# Patient Record
Sex: Female | Born: 1965 | Race: White | Hispanic: No | Marital: Married | Smoking: Never smoker
Health system: Southern US, Community
[De-identification: ages and names within clinical notes are randomized; demographics above are authoritative.]

## PROBLEM LIST (undated history)

## (undated) DIAGNOSIS — Z98811 Dental restoration status: Secondary | ICD-10-CM

## (undated) DIAGNOSIS — I671 Cerebral aneurysm, nonruptured: Secondary | ICD-10-CM

## (undated) DIAGNOSIS — I10 Essential (primary) hypertension: Secondary | ICD-10-CM

## (undated) DIAGNOSIS — M1990 Unspecified osteoarthritis, unspecified site: Secondary | ICD-10-CM

## (undated) DIAGNOSIS — Z9889 Other specified postprocedural states: Secondary | ICD-10-CM

## (undated) DIAGNOSIS — R112 Nausea with vomiting, unspecified: Secondary | ICD-10-CM

## (undated) DIAGNOSIS — G43909 Migraine, unspecified, not intractable, without status migrainosus: Secondary | ICD-10-CM

## (undated) DIAGNOSIS — N6310 Unspecified lump in the right breast, unspecified quadrant: Secondary | ICD-10-CM

## (undated) HISTORY — PX: TONSILLECTOMY AND ADENOIDECTOMY: SUR1326

## (undated) HISTORY — PX: BUNIONECTOMY: SHX129

## (undated) HISTORY — PX: BREAST EXCISIONAL BIOPSY: SUR124

---

## 1999-01-01 ENCOUNTER — Other Ambulatory Visit: Admission: RE | Admit: 1999-01-01 | Discharge: 1999-01-01 | Payer: Self-pay | Admitting: Family Medicine

## 2001-08-25 ENCOUNTER — Emergency Department (HOSPITAL_COMMUNITY): Admission: EM | Admit: 2001-08-25 | Discharge: 2001-08-25 | Payer: Self-pay | Admitting: Emergency Medicine

## 2001-08-25 ENCOUNTER — Encounter: Payer: Self-pay | Admitting: Emergency Medicine

## 2002-06-22 ENCOUNTER — Other Ambulatory Visit: Admission: RE | Admit: 2002-06-22 | Discharge: 2002-06-22 | Payer: Self-pay | Admitting: Family Medicine

## 2003-08-21 ENCOUNTER — Other Ambulatory Visit: Admission: RE | Admit: 2003-08-21 | Discharge: 2003-08-21 | Payer: Self-pay | Admitting: Obstetrics and Gynecology

## 2005-04-29 ENCOUNTER — Ambulatory Visit: Payer: Self-pay

## 2005-05-26 ENCOUNTER — Ambulatory Visit: Payer: Self-pay

## 2005-06-12 ENCOUNTER — Other Ambulatory Visit: Admission: RE | Admit: 2005-06-12 | Discharge: 2005-06-12 | Payer: Self-pay | Admitting: Obstetrics and Gynecology

## 2006-06-29 ENCOUNTER — Other Ambulatory Visit: Admission: RE | Admit: 2006-06-29 | Discharge: 2006-06-29 | Payer: Self-pay | Admitting: Family Medicine

## 2007-03-01 ENCOUNTER — Ambulatory Visit: Payer: Self-pay | Admitting: Neurosurgery

## 2007-08-04 ENCOUNTER — Other Ambulatory Visit: Admission: RE | Admit: 2007-08-04 | Discharge: 2007-08-04 | Payer: Self-pay | Admitting: Family Medicine

## 2008-08-02 ENCOUNTER — Encounter: Admission: RE | Admit: 2008-08-02 | Discharge: 2008-08-02 | Payer: Self-pay | Admitting: Family Medicine

## 2008-12-05 ENCOUNTER — Encounter: Admission: RE | Admit: 2008-12-05 | Discharge: 2008-12-05 | Payer: Self-pay | Admitting: Family Medicine

## 2009-05-16 ENCOUNTER — Encounter: Admission: RE | Admit: 2009-05-16 | Discharge: 2009-05-16 | Payer: Self-pay | Admitting: Family Medicine

## 2009-05-29 ENCOUNTER — Encounter: Admission: RE | Admit: 2009-05-29 | Discharge: 2009-05-29 | Payer: Self-pay | Admitting: Family Medicine

## 2009-09-11 ENCOUNTER — Encounter: Admission: RE | Admit: 2009-09-11 | Discharge: 2009-09-11 | Payer: Self-pay | Admitting: Family Medicine

## 2009-10-24 ENCOUNTER — Ambulatory Visit: Payer: Self-pay | Admitting: Neurosurgery

## 2010-05-31 ENCOUNTER — Encounter: Payer: Self-pay | Admitting: Family Medicine

## 2010-09-29 ENCOUNTER — Other Ambulatory Visit: Payer: Self-pay | Admitting: Family Medicine

## 2010-09-29 DIAGNOSIS — N6320 Unspecified lump in the left breast, unspecified quadrant: Secondary | ICD-10-CM

## 2010-11-28 ENCOUNTER — Other Ambulatory Visit: Payer: Self-pay | Admitting: Family Medicine

## 2010-11-28 DIAGNOSIS — M549 Dorsalgia, unspecified: Secondary | ICD-10-CM

## 2010-12-02 ENCOUNTER — Ambulatory Visit
Admission: RE | Admit: 2010-12-02 | Discharge: 2010-12-02 | Disposition: A | Discharge status: Home / Self Care | Payer: Self-pay | Source: Ambulatory Visit | Attending: Family Medicine | Admitting: Family Medicine

## 2010-12-02 DIAGNOSIS — M549 Dorsalgia, unspecified: Secondary | ICD-10-CM

## 2011-01-26 ENCOUNTER — Other Ambulatory Visit: Payer: Self-pay | Admitting: Family Medicine

## 2011-01-26 DIAGNOSIS — M549 Dorsalgia, unspecified: Secondary | ICD-10-CM

## 2011-01-26 DIAGNOSIS — M5430 Sciatica, unspecified side: Secondary | ICD-10-CM

## 2011-01-30 ENCOUNTER — Ambulatory Visit
Admission: RE | Admit: 2011-01-30 | Discharge: 2011-01-30 | Disposition: A | Discharge status: Home / Self Care | Payer: PRIVATE HEALTH INSURANCE | Source: Ambulatory Visit | Attending: Family Medicine | Admitting: Family Medicine

## 2011-01-30 DIAGNOSIS — M5430 Sciatica, unspecified side: Secondary | ICD-10-CM

## 2011-01-30 DIAGNOSIS — M549 Dorsalgia, unspecified: Secondary | ICD-10-CM

## 2011-03-10 ENCOUNTER — Other Ambulatory Visit: Payer: Self-pay | Admitting: Family Medicine

## 2011-03-10 ENCOUNTER — Other Ambulatory Visit (HOSPITAL_COMMUNITY)
Admission: RE | Admit: 2011-03-10 | Discharge: 2011-03-10 | Disposition: A | Discharge status: Home / Self Care | Payer: PRIVATE HEALTH INSURANCE | Source: Ambulatory Visit | Attending: Family Medicine | Admitting: Family Medicine

## 2011-03-10 DIAGNOSIS — Z0000 Encounter for general adult medical examination without abnormal findings: Secondary | ICD-10-CM | POA: Insufficient documentation
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
## 2011-04-28 ENCOUNTER — Encounter (HOSPITAL_BASED_OUTPATIENT_CLINIC_OR_DEPARTMENT_OTHER): Payer: Self-pay | Admitting: *Deleted

## 2011-04-28 NOTE — Pre-Procedure Instructions (Addendum)
To come for BMET and EKG  Last MRI req. from [facility]

## 2011-04-29 ENCOUNTER — Encounter (HOSPITAL_BASED_OUTPATIENT_CLINIC_OR_DEPARTMENT_OTHER): Payer: Self-pay | Admitting: *Deleted

## 2011-04-29 ENCOUNTER — Other Ambulatory Visit: Payer: Self-pay

## 2011-04-29 ENCOUNTER — Encounter (HOSPITAL_BASED_OUTPATIENT_CLINIC_OR_DEPARTMENT_OTHER)
Admission: RE | Admit: 2011-04-29 | Discharge: 2011-04-29 | Disposition: A | Discharge status: Home / Self Care | Payer: 59 | Source: Ambulatory Visit | Attending: Orthopedic Surgery | Admitting: Orthopedic Surgery

## 2011-04-29 LAB — BASIC METABOLIC PANEL
BUN: 7 mg/dL (ref 6–23)
CO2: 29 mEq/L (ref 19–32)
GFR calc Af Amer: >90 mL/min (ref >90)
GFR calc non Af Amer: >90 mL/min (ref >90)
Glucose, Bld: 103 mg/dL — ABNORMAL HIGH (ref 70–99)

## 2011-04-29 NOTE — Progress Notes (Signed)
Dr. Gelene Mink reviewed MRI and note from [facility] on cerebral aneurysm --- ok for surgery

## 2011-04-30 ENCOUNTER — Encounter (HOSPITAL_BASED_OUTPATIENT_CLINIC_OR_DEPARTMENT_OTHER): Payer: Self-pay | Admitting: Anesthesiology

## 2011-04-30 ENCOUNTER — Encounter (HOSPITAL_BASED_OUTPATIENT_CLINIC_OR_DEPARTMENT_OTHER): Payer: Self-pay

## 2011-04-30 ENCOUNTER — Ambulatory Visit (HOSPITAL_BASED_OUTPATIENT_CLINIC_OR_DEPARTMENT_OTHER)
Admission: RE | Admit: 2011-04-30 | Discharge: 2011-04-30 | Disposition: A | Discharge status: Home / Self Care | Payer: 59 | Source: Ambulatory Visit | Attending: Orthopedic Surgery | Admitting: Orthopedic Surgery

## 2011-04-30 ENCOUNTER — Ambulatory Visit (HOSPITAL_BASED_OUTPATIENT_CLINIC_OR_DEPARTMENT_OTHER): Payer: 59 | Admitting: Anesthesiology

## 2011-04-30 ENCOUNTER — Encounter (HOSPITAL_BASED_OUTPATIENT_CLINIC_OR_DEPARTMENT_OTHER)
Admission: RE | Disposition: A | Discharge status: Home / Self Care | Payer: Self-pay | Source: Ambulatory Visit | Attending: Orthopedic Surgery

## 2011-04-30 DIAGNOSIS — Z4789 Encounter for other orthopedic aftercare: Secondary | ICD-10-CM

## 2011-04-30 DIAGNOSIS — M949 Disorder of cartilage, unspecified: Secondary | ICD-10-CM | POA: Insufficient documentation

## 2011-04-30 DIAGNOSIS — M25819 Other specified joint disorders, unspecified shoulder: Secondary | ICD-10-CM | POA: Insufficient documentation

## 2011-04-30 DIAGNOSIS — Z01810 Encounter for preprocedural cardiovascular examination: Secondary | ICD-10-CM | POA: Insufficient documentation

## 2011-04-30 DIAGNOSIS — M67919 Unspecified disorder of synovium and tendon, unspecified shoulder: Secondary | ICD-10-CM | POA: Insufficient documentation

## 2011-04-30 DIAGNOSIS — Z01812 Encounter for preprocedural laboratory examination: Secondary | ICD-10-CM | POA: Insufficient documentation

## 2011-04-30 DIAGNOSIS — M899 Disorder of bone, unspecified: Secondary | ICD-10-CM | POA: Insufficient documentation

## 2011-04-30 DIAGNOSIS — M719 Bursopathy, unspecified: Secondary | ICD-10-CM | POA: Insufficient documentation

## 2011-04-30 HISTORY — DX: Cerebral aneurysm, nonruptured: I67.1

## 2011-04-30 HISTORY — DX: Essential (primary) hypertension: I10

## 2011-04-30 HISTORY — PX: SHOULDER ARTHROSCOPY: SHX128

## 2011-04-30 SURGERY — ARTHROSCOPY, SHOULDER
Anesthesia: General | Site: Shoulder | Laterality: Right | Wound class: Clean

## 2011-04-30 MED ORDER — NEOSTIGMINE METHYLSULFATE 1 MG/ML IJ SOLN
INTRAMUSCULAR | Status: DC | PRN
  Administered 2011-04-30: 4 mg via INTRAVENOUS

## 2011-04-30 MED ORDER — CEFAZOLIN SODIUM 1-5 GM-% IV SOLN: 1.0000 g | INTRAVENOUS | Status: DC

## 2011-04-30 MED ORDER — MIDAZOLAM HCL 5 MG/5ML IJ SOLN
INTRAMUSCULAR | Status: DC | PRN
  Administered 2011-04-30: 2 mg via INTRAVENOUS

## 2011-04-30 MED ORDER — KETOROLAC TROMETHAMINE 30 MG/ML IJ SOLN: 15.0000 mg | Freq: Once | INTRAMUSCULAR | Status: DC | PRN

## 2011-04-30 MED ORDER — ONDANSETRON HCL 4 MG/2ML IJ SOLN
INTRAMUSCULAR | Status: DC | PRN
  Administered 2011-04-30: 4 mg via INTRAVENOUS

## 2011-04-30 MED ORDER — SODIUM CHLORIDE 0.9 % IR SOLN
Status: DC | PRN
  Administered 2011-04-30: 3000 mL

## 2011-04-30 MED ORDER — FENTANYL CITRATE 0.05 MG/ML IJ SOLN
INTRAMUSCULAR | Status: DC | PRN
  Administered 2011-04-30: 100 ug via INTRAVENOUS
  Administered 2011-04-30: 50 ug via INTRAVENOUS

## 2011-04-30 MED ORDER — CHLORHEXIDINE GLUCONATE 4 % EX LIQD: 60.0000 mL | Freq: Once | CUTANEOUS | Status: DC

## 2011-04-30 MED ORDER — HYDROMORPHONE HCL PF 1 MG/ML IJ SOLN
0.2500 mg | INTRAMUSCULAR | Status: DC | PRN
  Administered 2011-04-30 (×4): 0.5 mg via INTRAVENOUS

## 2011-04-30 MED ORDER — PROMETHAZINE HCL 25 MG/ML IJ SOLN: 6.2500 mg | INTRAMUSCULAR | Status: DC | PRN

## 2011-04-30 MED ORDER — BUPIVACAINE HCL (PF) 0.5 % IJ SOLN
INTRAMUSCULAR | Status: DC | PRN
  Administered 2011-04-30: 20 mL via INTRA_ARTICULAR

## 2011-04-30 MED ORDER — ROCURONIUM BROMIDE 100 MG/10ML IV SOLN
INTRAVENOUS | Status: DC | PRN
  Administered 2011-04-30: 40 mg via INTRAVENOUS

## 2011-04-30 MED ORDER — DEXAMETHASONE SODIUM PHOSPHATE 4 MG/ML IJ SOLN
INTRAMUSCULAR | Status: DC | PRN
  Administered 2011-04-30: 10 mg via INTRAVENOUS

## 2011-04-30 MED ORDER — CEFAZOLIN SODIUM-DEXTROSE 2-3 GM-% IV SOLR
2.0000 g | INTRAVENOUS | Status: AC
  Administered 2011-04-30: 2 g via INTRAVENOUS

## 2011-04-30 MED ORDER — KETOROLAC TROMETHAMINE 30 MG/ML IJ SOLN
INTRAMUSCULAR | Status: DC | PRN
  Administered 2011-04-30: 30 mg via INTRAVENOUS

## 2011-04-30 MED ORDER — LACTATED RINGERS IV SOLN
INTRAVENOUS | Status: DC
  Administered 2011-04-30 (×2): via INTRAVENOUS

## 2011-04-30 MED ORDER — PROPOFOL 10 MG/ML IV EMUL
INTRAVENOUS | Status: DC | PRN
  Administered 2011-04-30: 200 mg via INTRAVENOUS

## 2011-04-30 MED ORDER — GLYCOPYRROLATE 0.2 MG/ML IJ SOLN
INTRAMUSCULAR | Status: DC | PRN
  Administered 2011-04-30: .6 mg via INTRAVENOUS

## 2011-04-30 SURGICAL SUPPLY — 71 items
BENZOIN TINCTURE PRP APPL 2/3 (GAUZE/BANDAGES/DRESSINGS) IMPLANT
BLADE CUTTER GATOR 3.5 (BLADE) ×2 IMPLANT
BLADE CUTTER MENIS 5.5 (BLADE) IMPLANT
BLADE GREAT WHITE 4.2 (BLADE) ×2 IMPLANT
BLADE SURG 15 STRL LF DISP TIS (BLADE) IMPLANT
BLADE SURG 15 STRL SS (BLADE)
BUR OVAL 6.0 (BURR) ×2 IMPLANT
CANISTER OMNI JUG 16 LITER (MISCELLANEOUS) IMPLANT
CANISTER SUCTION 2500CC (MISCELLANEOUS) IMPLANT
CANNULA TWIST IN 8.25X7CM (CANNULA) IMPLANT
CLOTH BEACON ORANGE TIMEOUT ST (SAFETY) ×2 IMPLANT
DECANTER SPIKE VIAL GLASS SM (MISCELLANEOUS) IMPLANT
DRAPE OEC MINIVIEW 54X84 (DRAPES) IMPLANT
DRAPE STERI 35X30 U-POUCH (DRAPES) ×2 IMPLANT
DRAPE U-SHAPE 47X51 STRL (DRAPES) ×2 IMPLANT
DRAPE U-SHAPE 76X120 STRL (DRAPES) ×4 IMPLANT
DRSG PAD ABDOMINAL 8X10 ST (GAUZE/BANDAGES/DRESSINGS) ×2 IMPLANT
DURAPREP 26ML APPLICATOR (WOUND CARE) ×2 IMPLANT
ELECT MENISCUS 165MM 90D (ELECTRODE) ×2 IMPLANT
ELECT NEEDLE TIP 2.8 STRL (NEEDLE) IMPLANT
ELECT REM PT RETURN 9FT ADLT (ELECTROSURGICAL) ×2
ELECTRODE REM PT RTRN 9FT ADLT (ELECTROSURGICAL) ×1 IMPLANT
GAUZE XEROFORM 1X8 LF (GAUZE/BANDAGES/DRESSINGS) ×2 IMPLANT
GLOVE BIO SURGEON STRL SZ 6.5 (GLOVE) ×4 IMPLANT
GLOVE BIOGEL PI IND STRL 7.0 (GLOVE) ×2 IMPLANT
GLOVE BIOGEL PI IND STRL 8 (GLOVE) ×1 IMPLANT
GLOVE BIOGEL PI INDICATOR 7.0 (GLOVE) ×2
GLOVE BIOGEL PI INDICATOR 8 (GLOVE) ×1
GLOVE ORTHO TXT STRL SZ7.5 (GLOVE) ×4 IMPLANT
GOWN BRE IMP PREV XXLGXLNG (GOWN DISPOSABLE) ×2 IMPLANT
GOWN PREVENTION PLUS XLARGE (GOWN DISPOSABLE) ×6 IMPLANT
NDL SUT 6 .5 CRC .975X.05 MAYO (NEEDLE) IMPLANT
NEEDLE MAYO TAPER (NEEDLE)
NEEDLE SCORPION MULTI FIRE (NEEDLE) IMPLANT
NS IRRIG 1000ML POUR BTL (IV SOLUTION) IMPLANT
PACK ARTHROSCOPY DSU (CUSTOM PROCEDURE TRAY) ×2 IMPLANT
PACK BASIN DAY SURGERY FS (CUSTOM PROCEDURE TRAY) ×2 IMPLANT
PASSER SUT SWANSON 36MM LOOP (INSTRUMENTS) IMPLANT
PENCIL BUTTON HOLSTER BLD 10FT (ELECTRODE) ×2 IMPLANT
SET ARTHROSCOPY TUBING (MISCELLANEOUS) ×1
SET ARTHROSCOPY TUBING LN (MISCELLANEOUS) ×1 IMPLANT
SLEEVE SCD COMPRESS KNEE MED (MISCELLANEOUS) ×2 IMPLANT
SLING ARM FOAM STRAP LRG (SOFTGOODS) ×2 IMPLANT
SLING ARM FOAM STRAP MED (SOFTGOODS) IMPLANT
SLING ARM FOAM STRAP XLG (SOFTGOODS) IMPLANT
SLING ARM IMMOBILIZER LRG (SOFTGOODS) IMPLANT
SLING ARM IMMOBILIZER MED (SOFTGOODS) IMPLANT
SLING ARM LGE (CAST SUPPLIES) ×2 IMPLANT
SPONGE GAUZE 4X4 12PLY (GAUZE/BANDAGES/DRESSINGS) ×2 IMPLANT
SPONGE LAP 4X18 X RAY DECT (DISPOSABLE) IMPLANT
STRIP CLOSURE SKIN 1/2X4 (GAUZE/BANDAGES/DRESSINGS) IMPLANT
SUCTION FRAZIER TIP 10 FR DISP (SUCTIONS) IMPLANT
SUT ETHIBOND 2 OS 4 DA (SUTURE) IMPLANT
SUT ETHILON 2 0 FS 18 (SUTURE) IMPLANT
SUT ETHILON 3 0 PS 1 (SUTURE) ×2 IMPLANT
SUT FIBERWIRE #2 38 T-5 BLUE (SUTURE)
SUT RETRIEVER MED (INSTRUMENTS) IMPLANT
SUT STEEL 4 (SUTURE) IMPLANT
SUT STEEL 5 (SUTURE) IMPLANT
SUT TIGER TAPE 7 IN WHITE (SUTURE) IMPLANT
SUT VIC AB 0 CT1 27 (SUTURE)
SUT VIC AB 0 CT1 27XBRD ANBCTR (SUTURE) IMPLANT
SUT VIC AB 2-0 SH 27 (SUTURE)
SUT VIC AB 2-0 SH 27XBRD (SUTURE) IMPLANT
SUT VIC AB 3-0 FS2 27 (SUTURE) IMPLANT
SUTURE FIBERWR #2 38 T-5 BLUE (SUTURE) IMPLANT
TAPE CLOTH SURG 6X10 WHT LF (GAUZE/BANDAGES/DRESSINGS) ×2 IMPLANT
TAPE FIBER 2MM 7IN #2 BLUE (SUTURE) IMPLANT
TOWEL OR 17X24 6PK STRL BLUE (TOWEL DISPOSABLE) ×2 IMPLANT
WATER STERILE IRR 1000ML POUR (IV SOLUTION) ×2 IMPLANT
YANKAUER SUCT BULB TIP NO VENT (SUCTIONS) IMPLANT

## 2011-04-30 NOTE — Brief Op Note (Signed)
04/30/2011  12:21 PM  PATIENT:  [name] [name]  45 y.o. female  PRE-OPERATIVE DIAGNOSIS:  Right  shoulder degenerative arthritis with impingment  POST-OPERATIVE DIAGNOSIS:  Right  shoulder degenerative arthritis with impingment, partial thickness rotator cuff tear  PROCEDURE:  Procedure(s): Right ARTHROSCOPY SHOULDER with sad, dce, debridement  SURGEON:  Surgeon(s): Loreta Ave, MD  PHYSICIAN ASSISTANT: Zonia Kief M   ANESTHESIA:   general  EBL:  Total I/O In: 800 [I.V.:800] Out: -    SPECIMEN:  No Specimen  DISPOSITION OF SPECIMEN:  N/A  COUNTS:  YES  TOURNIQUET:  * No tourniquets in log *  PATIENT DISPOSITION:  PACU - hemodynamically stable.

## 2011-04-30 NOTE — Anesthesia Postprocedure Evaluation (Signed)
Anesthesia Post Note  Patient: [name] [name]  Procedure(s) Performed:  ARTHROSCOPY SHOULDER - right shoulder scope, distal claviculectomy,decompression subacromial partial acromioplasty with coracoacromial release  Anesthesia type: General  Patient location: PACU  Post pain: Pain level controlled and Adequate analgesia  Post assessment: Post-op Vital signs reviewed, Patient's Cardiovascular Status Stable, Respiratory Function Stable, Patent Airway and Pain level controlled  Last Vitals:  Filed Vitals:   04/30/11 1245  BP: 101/62  Pulse: 62  Temp:   Resp: 15    Post vital signs: Reviewed and stable  Level of consciousness: awake, alert  and oriented  Complications: No apparent anesthesia complications

## 2011-04-30 NOTE — Anesthesia Procedure Notes (Signed)
Procedure Name: Intubation Performed by: Sharyne Richters Pre-anesthesia Checklist: Patient identified, Timeout performed, Emergency Drugs available, Suction available and Patient being monitored Patient Re-evaluated:Patient Re-evaluated prior to inductionOxygen Delivery Method: Circle System Utilized Preoxygenation: Pre-oxygenation with 100% oxygen Intubation Type: IV induction Ventilation: Mask ventilation without difficulty Laryngoscope Size: Miller and 2 Grade View: Grade II Tube type: Oral Number of attempts: 1 Placement Confirmation: ETT inserted through vocal cords under direct vision,  breath sounds checked- equal and bilateral and positive ETCO2 Secured at: 22 cm Tube secured with: Tape Dental Injury: Teeth and Oropharynx as per pre-operative assessment

## 2011-04-30 NOTE — Transfer of Care (Signed)
Immediate Anesthesia Transfer of Care Note  Patient: [name] [name]  Procedure(s) Performed:  ARTHROSCOPY SHOULDER - right shoulder scope, distal claviculectomy,decompression subacromial partial acromioplasty with coracoacromial release  Patient Location: PACU  Anesthesia Type: General  Level of Consciousness: awake, alert  and oriented  Airway & Oxygen Therapy: Patient Spontanous Breathing and Patient connected to face mask oxygen  Post-op Assessment: Report given to PACU RN and Post -op Vital signs reviewed and stable  Post vital signs: Reviewed and stable  Complications: No apparent anesthesia complications

## 2011-04-30 NOTE — Interval H&P Note (Signed)
History and Physical Interval Note:  04/30/2011 7:33 AM  [name] [name]  has presented today for surgery, with the diagnosis of rt shd degen arthritis, a/c joint, rc rupture  The various methods of treatment have been discussed with the patient and family. After consideration of risks, benefits and other options for treatment, the patient has consented to  Procedure(s):RIGHT ARTHROSCOPY SHOULDER as a surgical intervention .  The patients' history has been reviewed, patient examined, no change in status, stable for surgery.  I have reviewed the patients' chart and labs.  Questions were answered to the patient's satisfaction.     Princes Finger F

## 2011-04-30 NOTE — Anesthesia Preprocedure Evaluation (Addendum)
Anesthesia Evaluation  Patient identified by MRN, date of birth, ID band Patient awake  General Assessment Comment:Allergy to Lidocaine, discussed c Patient. We elected to forgo block this day c other -caine local anesthetics.  Reviewed: Allergy & Precautions, H&P , NPO status , Patient's Chart, lab work & pertinent test results  History of Anesthesia Complications Negative for: history of anesthetic complications  Airway Mallampati: I  Neck ROM: Full    Dental  (+) Teeth Intact and Dental Advisory Given   Pulmonary neg pulmonary ROS,  clear to auscultation        Cardiovascular hypertension, Pt. on medications Regular Normal    Neuro/Psych    GI/Hepatic negative GI ROS, Neg liver ROS,   Endo/Other  Negative Endocrine ROS  Renal/GU negative Renal ROS     Musculoskeletal  (+) Arthritis -, Osteoarthritis,    Abdominal (+) obese,   Peds  Hematology negative hematology ROS (+)   Anesthesia Other Findings   Reproductive/Obstetrics                          Anesthesia Physical Anesthesia Plan  ASA: II  Anesthesia Plan: General   Post-op Pain Management:    Induction: Intravenous  Airway Management Planned: Oral ETT  Additional Equipment:   Intra-op Plan:   Post-operative Plan:   Informed Consent: I have reviewed the patients History and Physical, chart, labs and discussed the procedure including the risks, benefits and alternatives for the proposed anesthesia with the patient or authorized representative who has indicated his/her understanding and acceptance.   Dental advisory given  Plan Discussed with: CRNA and Surgeon  Anesthesia Plan Comments:         Anesthesia Quick Evaluation

## 2011-05-01 ENCOUNTER — Encounter (HOSPITAL_BASED_OUTPATIENT_CLINIC_OR_DEPARTMENT_OTHER): Payer: Self-pay | Admitting: Orthopedic Surgery

## 2011-05-01 NOTE — Op Note (Signed)
NAME:  [name] [name], [name] [name]                    ACCOUNT NO.:  MEDICAL RECORD NO.:  [id number]  LOCATION:                                 FACILITY:  PHYSICIAN:  Loreta Ave, M.D. DATE OF BIRTH:  1965-09-24  DATE OF PROCEDURE:  04/30/2011 DATE OF DISCHARGE:                              OPERATIVE REPORT   PREOPERATIVE DIAGNOSES:  Right shoulder chronic impingement, failed conservative treatment.  Marked distal clavicle osteolysis.  POSTOPERATIVE DIAGNOSES:  Right shoulder chronic impingement, failed conservative treatment.  Marked distal clavicle osteolysis.  Some complex tearing, anterior and posterior labrum.  Partial thickness tearing, undersurface supraspinatus tendon, anterior aspect.  Partial tearing, superior border of subscapular tendon.  Nothing full-thickness.  PROCEDURE:  Right shoulder exam under anesthesia, arthroscopy. Debridement of rotator cuff and labrum.  Bursectomy, acromioplasty, CA ligament release.  Excision distal clavicle.  SURGEON:  Loreta Ave, MD  ASSISTANT:  Genene Churn. Barry Dienes, PA present throughout the entire case, necessary for timely completion of procedure.  ANESTHESIA:  General.  BLOOD LOSS:  Minimal.  SPECIMENS:  None.  CULTURES:  None.  COMPLICATION:  None.  DRESSINGS:  Soft compressive with sling.  PROCEDURE:  The patient was brought to the operating room, placed on the operating table in supine position.  After adequate anesthesia had been obtained, shoulder examined.  Full motion, stable shoulder.  Placed in beach chair position on the shoulder positioner, prepped and draped in usual sterile fashion.  Three portals anterior-posterior lateral. Arthroscope introduced, shoulder distended and inspected.  Complex tearing of the labrum in the front and back debrided.  Biceps tendon, biceps anchor, articular cartilage, capsule ligamentous structures otherwise looked good.  Marked tearing along the top of the subscap debrided.  Although  there was a small split there, it did not allow the biceps to sublux out.  Partial tearing on the undersurface of the supraspinatus crescent region also debrided.  Nothing full-thickness. Cannula redirected subacromially.  Type 2 acromion.  Marked abrasive changes on the top of the cuff from impingement from the acromion and also from grade 4 changes of marked spurring and hypertrophy, swelling at the [facility] joint.  Cuff debrided.  Bursa resected.  Acromioplasty to a type 1 acromion with shaver and high-speed bur.  CA ligament released. Distal clavicle exposed.  Periarticular spurs, lateral cm of clavicle resected.  Adequacy of debridement, decompression confirmed viewing throughout.  Instruments and fluids were removed.  Portals were closed with nylon.  Subacromial injection of Marcaine.  Sterile compressive dressing applied.  Sling applied.  Anesthesia reversed.  Brought to recovery room.  Tolerated surgery well.  No complications.     Loreta Ave, M.D.     DFM/MEDQ  D:  04/30/2011  T:  05/01/2011  Job:  [id number]

## 2011-05-22 ENCOUNTER — Encounter (HOSPITAL_BASED_OUTPATIENT_CLINIC_OR_DEPARTMENT_OTHER): Payer: Self-pay | Admitting: *Deleted

## 2011-05-22 ENCOUNTER — Emergency Department (INDEPENDENT_AMBULATORY_CARE_PROVIDER_SITE_OTHER): Payer: PRIVATE HEALTH INSURANCE

## 2011-05-22 ENCOUNTER — Emergency Department (HOSPITAL_BASED_OUTPATIENT_CLINIC_OR_DEPARTMENT_OTHER)
Admission: EM | Admit: 2011-05-22 | Discharge: 2011-05-23 | Disposition: A | Discharge status: Home / Self Care | Payer: PRIVATE HEALTH INSURANCE | Attending: Emergency Medicine | Admitting: Emergency Medicine

## 2011-05-22 DIAGNOSIS — R079 Chest pain, unspecified: Secondary | ICD-10-CM

## 2011-05-22 DIAGNOSIS — M25569 Pain in unspecified knee: Secondary | ICD-10-CM

## 2011-05-22 DIAGNOSIS — Y92410 Unspecified street and highway as the place of occurrence of the external cause: Secondary | ICD-10-CM | POA: Insufficient documentation

## 2011-05-22 DIAGNOSIS — I10 Essential (primary) hypertension: Secondary | ICD-10-CM | POA: Insufficient documentation

## 2011-05-22 DIAGNOSIS — R109 Unspecified abdominal pain: Secondary | ICD-10-CM | POA: Insufficient documentation

## 2011-05-22 DIAGNOSIS — S8000XA Contusion of unspecified knee, initial encounter: Secondary | ICD-10-CM

## 2011-05-22 DIAGNOSIS — IMO0002 Reserved for concepts with insufficient information to code with codable children: Secondary | ICD-10-CM

## 2011-05-22 LAB — URINALYSIS, ROUTINE W REFLEX MICROSCOPIC
Ketones, ur: NEGATIVE mg/dL
Leukocytes, UA: NEGATIVE
Nitrite: NEGATIVE
Specific Gravity, Urine: 1.01 (ref 1.005–1.030)
Urobilinogen, UA: 0.2 mg/dL (ref 0.0–1.0)
pH: 6 (ref 5.0–8.0)

## 2011-05-22 LAB — CBC
HCT: 40.1 % (ref 36.0–46.0)
Hemoglobin: 13.9 g/dL (ref 12.0–15.0)
MCH: 30.2 pg (ref 26.0–34.0)
MCHC: 34.7 g/dL (ref 30.0–36.0)
MCV: 87.2 fL (ref 78.0–100.0)

## 2011-05-22 LAB — LIPASE, BLOOD: Lipase: 31 U/L (ref 11–59)

## 2011-05-22 LAB — COMPREHENSIVE METABOLIC PANEL
Albumin: 4.1 g/dL (ref 3.5–5.2)
BUN: 12 mg/dL (ref 6–23)
Creatinine, Ser: 0.6 mg/dL (ref 0.50–1.10)
GFR calc Af Amer: >90 mL/min (ref >90)
Total Protein: 7.5 g/dL (ref 6.0–8.3)

## 2011-05-22 LAB — DIFFERENTIAL
Basophils Relative: 0 % (ref 0–1)
Eosinophils Absolute: 0.2 10*3/uL (ref 0.0–0.7)
Eosinophils Relative: 2 % (ref 0–5)
Monocytes Absolute: 0.6 10*3/uL (ref 0.1–1.0)
Monocytes Relative: 7 % (ref 3–12)

## 2011-05-22 MED ORDER — SODIUM CHLORIDE 0.9 % IV SOLN
Freq: Once | INTRAVENOUS | Status: AC
  Administered 2011-05-22: 1000 mL via INTRAVENOUS

## 2011-05-22 MED ORDER — ACETAMINOPHEN 325 MG PO TABS
650.0000 mg | ORAL_TABLET | Freq: Once | ORAL | Status: AC
  Administered 2011-05-22: 650 mg via ORAL
  Filled 2011-05-22: qty 2

## 2011-05-22 MED ORDER — IOHEXOL 300 MG/ML  SOLN
100.0000 mL | Freq: Once | INTRAMUSCULAR | Status: AC | PRN
  Administered 2011-05-22: 100 mL via INTRAVENOUS
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
## 2011-07-09 ENCOUNTER — Ambulatory Visit
Admission: RE | Admit: 2011-07-09 | Discharge: 2011-07-09 | Disposition: A | Discharge status: Home / Self Care | Payer: Self-pay | Source: Ambulatory Visit | Attending: Family Medicine | Admitting: Family Medicine

## 2011-07-09 DIAGNOSIS — N6320 Unspecified lump in the left breast, unspecified quadrant: Secondary | ICD-10-CM

## 2012-05-27 ENCOUNTER — Other Ambulatory Visit: Payer: Self-pay | Admitting: Family Medicine

## 2012-05-27 DIAGNOSIS — Z1231 Encounter for screening mammogram for malignant neoplasm of breast: Secondary | ICD-10-CM
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
## 2012-06-03 ENCOUNTER — Other Ambulatory Visit (HOSPITAL_COMMUNITY)
Admission: RE | Admit: 2012-06-03 | Discharge: 2012-06-03 | Disposition: A | Discharge status: Home / Self Care | Payer: 59 | Source: Ambulatory Visit | Attending: Family Medicine | Admitting: Family Medicine

## 2012-06-03 ENCOUNTER — Other Ambulatory Visit: Payer: Self-pay | Admitting: Family Medicine

## 2012-06-03 DIAGNOSIS — Z0000 Encounter for general adult medical examination without abnormal findings: Secondary | ICD-10-CM | POA: Insufficient documentation

## 2012-07-15 ENCOUNTER — Ambulatory Visit: Payer: 59

## 2012-07-29 ENCOUNTER — Ambulatory Visit
Admission: RE | Admit: 2012-07-29 | Discharge: 2012-07-29 | Disposition: A | Discharge status: Home / Self Care | Payer: 59 | Source: Ambulatory Visit | Attending: Family Medicine | Admitting: Family Medicine

## 2012-07-29 DIAGNOSIS — Z1231 Encounter for screening mammogram for malignant neoplasm of breast: Secondary | ICD-10-CM

## 2012-08-03 ENCOUNTER — Other Ambulatory Visit: Payer: Self-pay | Admitting: Family Medicine

## 2012-08-03 DIAGNOSIS — R928 Other abnormal and inconclusive findings on diagnostic imaging of breast: Secondary | ICD-10-CM

## 2012-08-25 ENCOUNTER — Ambulatory Visit
Admission: RE | Admit: 2012-08-25 | Discharge: 2012-08-25 | Disposition: A | Discharge status: Home / Self Care | Payer: 59 | Source: Ambulatory Visit | Attending: Family Medicine | Admitting: Family Medicine

## 2012-08-25 DIAGNOSIS — R928 Other abnormal and inconclusive findings on diagnostic imaging of breast: Secondary | ICD-10-CM

## 2013-04-28 ENCOUNTER — Other Ambulatory Visit: Payer: Self-pay

## 2013-04-28 ENCOUNTER — Other Ambulatory Visit: Payer: Self-pay | Admitting: Family Medicine

## 2013-04-28 DIAGNOSIS — R921 Mammographic calcification found on diagnostic imaging of breast: Secondary | ICD-10-CM

## 2013-05-05 ENCOUNTER — Ambulatory Visit
Admission: RE | Admit: 2013-05-05 | Discharge: 2013-05-05 | Disposition: A | Discharge status: Home / Self Care | Payer: 59 | Source: Ambulatory Visit | Attending: Family Medicine | Admitting: Family Medicine

## 2013-05-05 DIAGNOSIS — R921 Mammographic calcification found on diagnostic imaging of breast: Secondary | ICD-10-CM

## 2013-10-16 ENCOUNTER — Other Ambulatory Visit: Payer: Self-pay | Admitting: Family Medicine

## 2013-10-16 DIAGNOSIS — R921 Mammographic calcification found on diagnostic imaging of breast: Secondary | ICD-10-CM

## 2013-10-25 ENCOUNTER — Encounter (INDEPENDENT_AMBULATORY_CARE_PROVIDER_SITE_OTHER): Payer: Self-pay

## 2013-10-25 ENCOUNTER — Ambulatory Visit
Admission: RE | Admit: 2013-10-25 | Discharge: 2013-10-25 | Disposition: A | Discharge status: Home / Self Care | Payer: 59 | Source: Ambulatory Visit | Attending: Family Medicine | Admitting: Family Medicine

## 2013-10-25 DIAGNOSIS — R921 Mammographic calcification found on diagnostic imaging of breast: Secondary | ICD-10-CM

## 2014-10-16 ENCOUNTER — Other Ambulatory Visit: Payer: Self-pay

## 2014-10-16 DIAGNOSIS — Z1231 Encounter for screening mammogram for malignant neoplasm of breast: Secondary | ICD-10-CM

## 2014-11-07 ENCOUNTER — Ambulatory Visit: Payer: 59

## 2014-11-09 ENCOUNTER — Ambulatory Visit
Admission: RE | Admit: 2014-11-09 | Discharge: 2014-11-09 | Disposition: A | Discharge status: Home / Self Care | Payer: 59 | Source: Ambulatory Visit

## 2014-11-09 DIAGNOSIS — Z1231 Encounter for screening mammogram for malignant neoplasm of breast: Secondary | ICD-10-CM

## 2014-11-14 ENCOUNTER — Other Ambulatory Visit: Payer: Self-pay | Admitting: Family Medicine

## 2014-11-14 DIAGNOSIS — R928 Other abnormal and inconclusive findings on diagnostic imaging of breast: Secondary | ICD-10-CM

## 2014-11-20 ENCOUNTER — Other Ambulatory Visit: Payer: 59

## 2014-11-23 ENCOUNTER — Ambulatory Visit
Admission: RE | Admit: 2014-11-23 | Discharge: 2014-11-23 | Disposition: A | Discharge status: Home / Self Care | Payer: 59 | Source: Ambulatory Visit | Attending: Family Medicine | Admitting: Family Medicine

## 2014-11-23 DIAGNOSIS — R928 Other abnormal and inconclusive findings on diagnostic imaging of breast: Secondary | ICD-10-CM

## 2015-08-22 ENCOUNTER — Other Ambulatory Visit: Payer: Self-pay | Admitting: Family Medicine

## 2015-08-22 ENCOUNTER — Other Ambulatory Visit (HOSPITAL_COMMUNITY)
Admission: RE | Admit: 2015-08-22 | Discharge: 2015-08-22 | Disposition: A | Discharge status: Home / Self Care | Payer: 59 | Source: Ambulatory Visit | Attending: Family Medicine | Admitting: Family Medicine

## 2015-08-22 DIAGNOSIS — Z01419 Encounter for gynecological examination (general) (routine) without abnormal findings: Secondary | ICD-10-CM | POA: Insufficient documentation

## 2015-08-23 LAB — CYTOLOGY - PAP

## 2015-12-11 ENCOUNTER — Other Ambulatory Visit: Payer: Self-pay | Admitting: Family Medicine

## 2015-12-11 DIAGNOSIS — R921 Mammographic calcification found on diagnostic imaging of breast: Secondary | ICD-10-CM

## 2015-12-16 ENCOUNTER — Other Ambulatory Visit: Payer: Self-pay | Admitting: Family Medicine

## 2015-12-16 ENCOUNTER — Ambulatory Visit
Admission: RE | Admit: 2015-12-16 | Discharge: 2015-12-16 | Disposition: A | Discharge status: Home / Self Care | Payer: 59 | Source: Ambulatory Visit | Attending: Family Medicine | Admitting: Family Medicine

## 2015-12-16 DIAGNOSIS — R921 Mammographic calcification found on diagnostic imaging of breast: Secondary | ICD-10-CM

## 2015-12-16 DIAGNOSIS — N6489 Other specified disorders of breast: Secondary | ICD-10-CM

## 2015-12-17 ENCOUNTER — Ambulatory Visit
Admission: RE | Admit: 2015-12-17 | Discharge: 2015-12-17 | Disposition: A | Discharge status: Home / Self Care | Payer: 59 | Source: Ambulatory Visit | Attending: Family Medicine | Admitting: Family Medicine

## 2015-12-17 DIAGNOSIS — N6489 Other specified disorders of breast: Secondary | ICD-10-CM

## 2015-12-31 ENCOUNTER — Other Ambulatory Visit: Payer: Self-pay | Admitting: General Surgery

## 2015-12-31 DIAGNOSIS — R928 Other abnormal and inconclusive findings on diagnostic imaging of breast: Secondary | ICD-10-CM

## 2016-01-10 DIAGNOSIS — N6310 Unspecified lump in the right breast, unspecified quadrant: Secondary | ICD-10-CM

## 2016-01-10 HISTORY — DX: Unspecified lump in the right breast, unspecified quadrant: N63.10

## 2016-01-31 ENCOUNTER — Encounter (HOSPITAL_BASED_OUTPATIENT_CLINIC_OR_DEPARTMENT_OTHER): Payer: Self-pay | Admitting: *Deleted

## 2016-01-31 NOTE — Pre-Procedure Instructions (Signed)
To come for BMET, EKG and to pick up 8 oz. Boost Breeze 

## 2016-02-06 ENCOUNTER — Other Ambulatory Visit: Payer: Self-pay

## 2016-02-06 ENCOUNTER — Encounter (HOSPITAL_BASED_OUTPATIENT_CLINIC_OR_DEPARTMENT_OTHER)
Admission: RE | Admit: 2016-02-06 | Discharge: 2016-02-06 | Disposition: A | Discharge status: Home / Self Care | Payer: 59 | Source: Ambulatory Visit | Attending: General Surgery | Admitting: General Surgery

## 2016-02-06 ENCOUNTER — Ambulatory Visit
Admission: RE | Admit: 2016-02-06 | Discharge: 2016-02-06 | Disposition: A | Discharge status: Home / Self Care | Payer: 59 | Source: Ambulatory Visit | Attending: General Surgery | Admitting: General Surgery

## 2016-02-06 DIAGNOSIS — Z9889 Other specified postprocedural states: Secondary | ICD-10-CM | POA: Diagnosis not present

## 2016-02-06 DIAGNOSIS — Z8249 Family history of ischemic heart disease and other diseases of the circulatory system: Secondary | ICD-10-CM | POA: Diagnosis not present

## 2016-02-06 DIAGNOSIS — Z808 Family history of malignant neoplasm of other organs or systems: Secondary | ICD-10-CM | POA: Diagnosis not present

## 2016-02-06 DIAGNOSIS — N63 Unspecified lump in breast: Secondary | ICD-10-CM | POA: Diagnosis present

## 2016-02-06 DIAGNOSIS — M1990 Unspecified osteoarthritis, unspecified site: Secondary | ICD-10-CM | POA: Diagnosis not present

## 2016-02-06 DIAGNOSIS — Z79899 Other long term (current) drug therapy: Secondary | ICD-10-CM | POA: Diagnosis not present

## 2016-02-06 DIAGNOSIS — I10 Essential (primary) hypertension: Secondary | ICD-10-CM | POA: Diagnosis not present

## 2016-02-06 DIAGNOSIS — R928 Other abnormal and inconclusive findings on diagnostic imaging of breast: Secondary | ICD-10-CM

## 2016-02-06 DIAGNOSIS — N6011 Diffuse cystic mastopathy of right breast: Secondary | ICD-10-CM | POA: Diagnosis not present

## 2016-02-06 DIAGNOSIS — Z887 Allergy status to serum and vaccine status: Secondary | ICD-10-CM | POA: Diagnosis not present

## 2016-02-06 DIAGNOSIS — Z885 Allergy status to narcotic agent status: Secondary | ICD-10-CM | POA: Diagnosis not present

## 2016-02-06 DIAGNOSIS — Z8349 Family history of other endocrine, nutritional and metabolic diseases: Secondary | ICD-10-CM | POA: Diagnosis not present

## 2016-02-06 DIAGNOSIS — Z8371 Family history of colonic polyps: Secondary | ICD-10-CM | POA: Diagnosis not present

## 2016-02-06 DIAGNOSIS — Z884 Allergy status to anesthetic agent status: Secondary | ICD-10-CM | POA: Diagnosis not present

## 2016-02-06 LAB — BASIC METABOLIC PANEL
ANION GAP: 8 (ref 5–15)
BUN: 11 mg/dL (ref 6–20)
CHLORIDE: 104 mmol/L (ref 101–111)
CO2: 24 mmol/L (ref 22–32)
CREATININE: 0.65 mg/dL (ref 0.44–1.00)
Calcium: 9.5 mg/dL (ref 8.9–10.3)
GFR calc non Af Amer: >60 mL/min (ref >60)
Glucose, Bld: 92 mg/dL (ref 65–99)
POTASSIUM: 3.8 mmol/L (ref 3.5–5.1)
SODIUM: 136 mmol/L (ref 135–145)

## 2016-02-06 NOTE — Pre-Procedure Instructions (Signed)
Boost Breeze 8 oz. given to pt. with instructions to drink by 0415 DOS

## 2016-02-07 ENCOUNTER — Ambulatory Visit (HOSPITAL_BASED_OUTPATIENT_CLINIC_OR_DEPARTMENT_OTHER): Payer: 59 | Admitting: Certified Registered"

## 2016-02-07 ENCOUNTER — Encounter (HOSPITAL_BASED_OUTPATIENT_CLINIC_OR_DEPARTMENT_OTHER): Payer: Self-pay | Admitting: *Deleted

## 2016-02-07 ENCOUNTER — Ambulatory Visit (HOSPITAL_BASED_OUTPATIENT_CLINIC_OR_DEPARTMENT_OTHER)
Admission: RE | Admit: 2016-02-07 | Discharge: 2016-02-07 | Disposition: A | Discharge status: Home / Self Care | Payer: 59 | Source: Ambulatory Visit | Attending: General Surgery | Admitting: General Surgery

## 2016-02-07 ENCOUNTER — Ambulatory Visit
Admission: RE | Admit: 2016-02-07 | Discharge: 2016-02-07 | Disposition: A | Discharge status: Home / Self Care | Payer: 59 | Source: Ambulatory Visit | Attending: General Surgery | Admitting: General Surgery

## 2016-02-07 ENCOUNTER — Encounter (HOSPITAL_BASED_OUTPATIENT_CLINIC_OR_DEPARTMENT_OTHER)
Admission: RE | Disposition: A | Discharge status: Home / Self Care | Payer: Self-pay | Source: Ambulatory Visit | Attending: General Surgery

## 2016-02-07 DIAGNOSIS — N6011 Diffuse cystic mastopathy of right breast: Secondary | ICD-10-CM | POA: Diagnosis not present

## 2016-02-07 DIAGNOSIS — Z887 Allergy status to serum and vaccine status: Secondary | ICD-10-CM | POA: Insufficient documentation

## 2016-02-07 DIAGNOSIS — Z808 Family history of malignant neoplasm of other organs or systems: Secondary | ICD-10-CM | POA: Insufficient documentation

## 2016-02-07 DIAGNOSIS — M1990 Unspecified osteoarthritis, unspecified site: Secondary | ICD-10-CM | POA: Insufficient documentation

## 2016-02-07 DIAGNOSIS — Z79899 Other long term (current) drug therapy: Secondary | ICD-10-CM | POA: Insufficient documentation

## 2016-02-07 DIAGNOSIS — I10 Essential (primary) hypertension: Secondary | ICD-10-CM | POA: Insufficient documentation

## 2016-02-07 DIAGNOSIS — Z9889 Other specified postprocedural states: Secondary | ICD-10-CM | POA: Insufficient documentation

## 2016-02-07 DIAGNOSIS — Z885 Allergy status to narcotic agent status: Secondary | ICD-10-CM | POA: Insufficient documentation

## 2016-02-07 DIAGNOSIS — Z8249 Family history of ischemic heart disease and other diseases of the circulatory system: Secondary | ICD-10-CM | POA: Insufficient documentation

## 2016-02-07 DIAGNOSIS — R928 Other abnormal and inconclusive findings on diagnostic imaging of breast: Secondary | ICD-10-CM

## 2016-02-07 DIAGNOSIS — Z8349 Family history of other endocrine, nutritional and metabolic diseases: Secondary | ICD-10-CM | POA: Insufficient documentation

## 2016-02-07 DIAGNOSIS — Z884 Allergy status to anesthetic agent status: Secondary | ICD-10-CM | POA: Insufficient documentation

## 2016-02-07 DIAGNOSIS — Z8371 Family history of colonic polyps: Secondary | ICD-10-CM | POA: Insufficient documentation

## 2016-02-07 HISTORY — DX: Unspecified lump in the right breast, unspecified quadrant: N63.10

## 2016-02-07 HISTORY — DX: Nausea with vomiting, unspecified: Z98.890

## 2016-02-07 HISTORY — DX: Migraine, unspecified, not intractable, without status migrainosus: G43.909

## 2016-02-07 HISTORY — DX: Dental restoration status: Z98.811

## 2016-02-07 HISTORY — DX: Other specified postprocedural states: R11.2

## 2016-02-07 HISTORY — PX: RADIOACTIVE SEED GUIDED EXCISIONAL BREAST BIOPSY: SHX6490

## 2016-02-07 HISTORY — DX: Unspecified osteoarthritis, unspecified site: M19.90

## 2016-02-07 SURGERY — RADIOACTIVE SEED GUIDED BREAST BIOPSY
Anesthesia: General | Site: Breast | Laterality: Right

## 2016-02-07 MED ORDER — FENTANYL CITRATE (PF) 100 MCG/2ML IJ SOLN
50.0000 ug | INTRAMUSCULAR | Status: DC | PRN
  Administered 2016-02-07: 50 ug via INTRAVENOUS

## 2016-02-07 MED ORDER — PROPOFOL 10 MG/ML IV BOLUS
INTRAVENOUS | Status: DC | PRN
  Administered 2016-02-07: 200 mg via INTRAVENOUS

## 2016-02-07 MED ORDER — SCOPOLAMINE 1 MG/3DAYS TD PT72
1.0000 | MEDICATED_PATCH | Freq: Once | TRANSDERMAL | Status: DC | PRN
  Administered 2016-02-07: 1.5 mg via TRANSDERMAL

## 2016-02-07 MED ORDER — LACTATED RINGERS IV SOLN
INTRAVENOUS | Status: DC
  Administered 2016-02-07 (×2): via INTRAVENOUS

## 2016-02-07 MED ORDER — CEFAZOLIN SODIUM-DEXTROSE 2-4 GM/100ML-% IV SOLN
INTRAVENOUS | Status: AC
  Filled 2016-02-07: qty 100

## 2016-02-07 MED ORDER — BUPIVACAINE HCL (PF) 0.25 % IJ SOLN
INTRAMUSCULAR | Status: AC
  Filled 2016-02-07: qty 30

## 2016-02-07 MED ORDER — LIDOCAINE HCL (PF) 1 % IJ SOLN
INTRAMUSCULAR | Status: AC
  Filled 2016-02-07: qty 30

## 2016-02-07 MED ORDER — DEXAMETHASONE SODIUM PHOSPHATE 10 MG/ML IJ SOLN
INTRAMUSCULAR | Status: AC
  Filled 2016-02-07: qty 1

## 2016-02-07 MED ORDER — HYDROCODONE-ACETAMINOPHEN 10-325 MG PO TABS: 1.0000 | ORAL_TABLET | Freq: Four times a day (QID) | ORAL | 0 refills | Status: DC | PRN

## 2016-02-07 MED ORDER — ACETAMINOPHEN 500 MG PO TABS
ORAL_TABLET | ORAL | Status: AC
  Filled 2016-02-07: qty 2

## 2016-02-07 MED ORDER — HYDROMORPHONE HCL 1 MG/ML IJ SOLN
INTRAMUSCULAR | Status: AC
  Filled 2016-02-07: qty 1

## 2016-02-07 MED ORDER — MIDAZOLAM HCL 2 MG/2ML IJ SOLN
INTRAMUSCULAR | Status: AC
  Filled 2016-02-07: qty 2

## 2016-02-07 MED ORDER — OXYCODONE HCL 5 MG PO TABS
5.0000 mg | ORAL_TABLET | Freq: Once | ORAL | Status: AC
  Administered 2016-02-07: 5 mg via ORAL

## 2016-02-07 MED ORDER — ONDANSETRON HCL 4 MG/2ML IJ SOLN
INTRAMUSCULAR | Status: AC
  Filled 2016-02-07: qty 2

## 2016-02-07 MED ORDER — ONDANSETRON HCL 4 MG/2ML IJ SOLN
INTRAMUSCULAR | Status: DC | PRN
  Administered 2016-02-07: 4 mg via INTRAVENOUS

## 2016-02-07 MED ORDER — ACETAMINOPHEN 500 MG PO TABS
1000.0000 mg | ORAL_TABLET | ORAL | Status: AC
  Administered 2016-02-07: 1000 mg via ORAL

## 2016-02-07 MED ORDER — GLYCOPYRROLATE 0.2 MG/ML IJ SOLN: 0.2000 mg | Freq: Once | INTRAMUSCULAR | Status: DC | PRN

## 2016-02-07 MED ORDER — SCOPOLAMINE 1 MG/3DAYS TD PT72
MEDICATED_PATCH | TRANSDERMAL | Status: AC
  Filled 2016-02-07: qty 1

## 2016-02-07 MED ORDER — GABAPENTIN 300 MG PO CAPS
ORAL_CAPSULE | ORAL | Status: AC
  Filled 2016-02-07: qty 1

## 2016-02-07 MED ORDER — MIDAZOLAM HCL 2 MG/2ML IJ SOLN
1.0000 mg | INTRAMUSCULAR | Status: DC | PRN
  Administered 2016-02-07: 2 mg via INTRAVENOUS

## 2016-02-07 MED ORDER — GABAPENTIN 300 MG PO CAPS
300.0000 mg | ORAL_CAPSULE | ORAL | Status: AC
  Administered 2016-02-07: 300 mg via ORAL

## 2016-02-07 MED ORDER — BUPIVACAINE-EPINEPHRINE (PF) 0.25% -1:200000 IJ SOLN
INTRAMUSCULAR | Status: AC
  Filled 2016-02-07: qty 30

## 2016-02-07 MED ORDER — HYDROMORPHONE HCL 1 MG/ML IJ SOLN
0.2500 mg | INTRAMUSCULAR | Status: DC | PRN
Start: 2016-02-07 — End: 2016-02-07
  Administered 2016-02-07 (×2): 0.5 mg via INTRAVENOUS
  Administered 2016-02-07: 0.25 mg via INTRAVENOUS

## 2016-02-07 MED ORDER — METHYLENE BLUE 0.5 % INJ SOLN
INTRAVENOUS | Status: AC
  Filled 2016-02-07: qty 10

## 2016-02-07 MED ORDER — CEFAZOLIN SODIUM-DEXTROSE 2-4 GM/100ML-% IV SOLN
2.0000 g | INTRAVENOUS | Status: AC
  Administered 2016-02-07: 2 g via INTRAVENOUS

## 2016-02-07 MED ORDER — CELECOXIB 200 MG PO CAPS
ORAL_CAPSULE | ORAL | Status: AC
  Filled 2016-02-07: qty 1

## 2016-02-07 MED ORDER — PROPOFOL 500 MG/50ML IV EMUL
INTRAVENOUS | Status: AC
  Filled 2016-02-07: qty 50

## 2016-02-07 MED ORDER — SODIUM CHLORIDE 0.9 % IJ SOLN
INTRAMUSCULAR | Status: AC
  Filled 2016-02-07: qty 10

## 2016-02-07 MED ORDER — CELECOXIB 200 MG PO CAPS
200.0000 mg | ORAL_CAPSULE | ORAL | Status: AC
  Administered 2016-02-07: 200 mg via ORAL

## 2016-02-07 MED ORDER — FENTANYL CITRATE (PF) 100 MCG/2ML IJ SOLN
INTRAMUSCULAR | Status: AC
  Filled 2016-02-07: qty 2

## 2016-02-07 MED ORDER — DEXAMETHASONE SODIUM PHOSPHATE 4 MG/ML IJ SOLN
INTRAMUSCULAR | Status: DC | PRN
  Administered 2016-02-07: 10 mg via INTRAVENOUS

## 2016-02-07 MED ORDER — BUPIVACAINE HCL (PF) 0.5 % IJ SOLN
INTRAMUSCULAR | Status: AC
  Filled 2016-02-07: qty 30

## 2016-02-07 MED ORDER — OXYCODONE HCL 5 MG PO TABS
ORAL_TABLET | ORAL | Status: AC
  Filled 2016-02-07: qty 1

## 2016-02-07 SURGICAL SUPPLY — 55 items
BINDER BREAST LRG (GAUZE/BANDAGES/DRESSINGS) IMPLANT
BINDER BREAST MEDIUM (GAUZE/BANDAGES/DRESSINGS) IMPLANT
BINDER BREAST XLRG (GAUZE/BANDAGES/DRESSINGS) IMPLANT
BINDER BREAST XXLRG (GAUZE/BANDAGES/DRESSINGS) IMPLANT
BLADE SURG 15 STRL LF DISP TIS (BLADE) ×1 IMPLANT
BLADE SURG 15 STRL SS (BLADE) ×2
CANISTER SUC SOCK COL 7IN (MISCELLANEOUS) IMPLANT
CANISTER SUCT 1200ML W/VALVE (MISCELLANEOUS) IMPLANT
CHLORAPREP W/TINT 26ML (MISCELLANEOUS) ×3 IMPLANT
CLIP TI WIDE RED SMALL 6 (CLIP) IMPLANT
CLOSURE WOUND 1/2 X4 (GAUZE/BANDAGES/DRESSINGS) ×1
COVER BACK TABLE 60X90IN (DRAPES) ×3 IMPLANT
COVER MAYO STAND STRL (DRAPES) ×3 IMPLANT
COVER PROBE W GEL 5X96 (DRAPES) ×3 IMPLANT
DECANTER SPIKE VIAL GLASS SM (MISCELLANEOUS) IMPLANT
DERMABOND ADVANCED (GAUZE/BANDAGES/DRESSINGS) ×2
DERMABOND ADVANCED .7 DNX12 (GAUZE/BANDAGES/DRESSINGS) ×1 IMPLANT
DEVICE DUBIN W/COMP PLATE 8390 (MISCELLANEOUS) ×3 IMPLANT
DRAPE LAPAROSCOPIC ABDOMINAL (DRAPES) ×3 IMPLANT
DRAPE UTILITY XL STRL (DRAPES) ×3 IMPLANT
DRSG TEGADERM 4X4.75 (GAUZE/BANDAGES/DRESSINGS) IMPLANT
ELECT COATED BLADE 2.86 ST (ELECTRODE) ×3 IMPLANT
ELECT REM PT RETURN 9FT ADLT (ELECTROSURGICAL) ×3
ELECTRODE REM PT RTRN 9FT ADLT (ELECTROSURGICAL) ×1 IMPLANT
GLOVE BIO SURGEON STRL SZ7 (GLOVE) ×6 IMPLANT
GLOVE BIOGEL PI IND STRL 7.5 (GLOVE) ×1 IMPLANT
GLOVE BIOGEL PI INDICATOR 7.5 (GLOVE) ×2
GOWN STRL REUS W/ TWL LRG LVL3 (GOWN DISPOSABLE) ×2 IMPLANT
GOWN STRL REUS W/TWL LRG LVL3 (GOWN DISPOSABLE) ×4
ILLUMINATOR WAVEGUIDE N/F (MISCELLANEOUS) ×3 IMPLANT
KIT MARKER MARGIN INK (KITS) ×3 IMPLANT
LIGHT WAVEGUIDE WIDE FLAT (MISCELLANEOUS) IMPLANT
NEEDLE HYPO 25X1 1.5 SAFETY (NEEDLE) ×3 IMPLANT
NS IRRIG 1000ML POUR BTL (IV SOLUTION) IMPLANT
PACK BASIN DAY SURGERY FS (CUSTOM PROCEDURE TRAY) ×3 IMPLANT
PENCIL BUTTON HOLSTER BLD 10FT (ELECTRODE) ×3 IMPLANT
SHEET MEDIUM DRAPE 40X70 STRL (DRAPES) IMPLANT
SLEEVE SCD COMPRESS KNEE MED (MISCELLANEOUS) ×3 IMPLANT
SPONGE GAUZE 4X4 12PLY STER LF (GAUZE/BANDAGES/DRESSINGS) IMPLANT
SPONGE LAP 4X18 X RAY DECT (DISPOSABLE) ×3 IMPLANT
STRIP CLOSURE SKIN 1/2X4 (GAUZE/BANDAGES/DRESSINGS) ×2 IMPLANT
SUT MNCRL AB 4-0 PS2 18 (SUTURE) IMPLANT
SUT MON AB 5-0 PS2 18 (SUTURE) IMPLANT
SUT SILK 2 0 SH (SUTURE) IMPLANT
SUT VIC AB 2-0 SH 27 (SUTURE) ×2
SUT VIC AB 2-0 SH 27XBRD (SUTURE) ×1 IMPLANT
SUT VIC AB 3-0 SH 27 (SUTURE) ×2
SUT VIC AB 3-0 SH 27X BRD (SUTURE) ×1 IMPLANT
SUT VIC AB 5-0 PS2 18 (SUTURE) IMPLANT
SYR CONTROL 10ML LL (SYRINGE) ×3 IMPLANT
TOWEL OR 17X24 6PK STRL BLUE (TOWEL DISPOSABLE) ×3 IMPLANT
TOWEL OR NON WOVEN STRL DISP B (DISPOSABLE) ×3 IMPLANT
TUBE CONNECTING 20'X1/4 (TUBING)
TUBE CONNECTING 20X1/4 (TUBING) IMPLANT
YANKAUER SUCT BULB TIP NO VENT (SUCTIONS) IMPLANT

## 2016-02-07 NOTE — Op Note (Signed)
Preoperative diagnoses: rightbreast mass  Postoperative diagnosis: Same as above Procedure:Rightbreast seed guided excisional biopsy Surgeon: Dr. Harden MoMatt Doralyn Kirkes Anesthesia: Gen. Estimated blood loss: Minimal Complications: None Drains: None Specimens:Rightbreast tissue marked with paint Sponge and needle count correct at completion Disposition to recovery stable  Indications: This is a 49yof who has a rightsided mammographic abnormality. We discussed seed guided excision.She had seed placed prior to surgery. I had her mm in the OR.   Procedure: After informed consent was obtained she was then taken to the operating room. She was given cefazolin. Sequential compression devices were on her legs. She was placed under general anesthesia without complication. Her rightbreast was then prepped and draped in the standard sterile surgical fashion. A surgical timeout was then performed.  I located the radioactive seed with the neoprobe.I infiltrated marcaine in the area of the seed. I made an axillary incision.I then used the neoprobe to guide the excision of the seed and surrounding tissue. This was confirmed by the neoprobe. This was painted. This was then taken for mammogram which confirmed removal of the seed and the clip. This was confirmed by radiology. This was then sent to pathology. Hemostasis was observed.I closed the breast tissue with a 2-0 Vicryl. The dermis was closed with 3-0 Vicryl and the skin with 4-0 Monocryl.Dermabond and steristrips were placed on the incision. She was transferred to recovery stable

## 2016-02-07 NOTE — Anesthesia Postprocedure Evaluation (Signed)
Anesthesia Post Note  Patient: [name] RuaDeborah [name]  Procedure(s) Performed: Procedure(s) (LRB): RADIOACTIVE SEED GUIDED EXCISIONAL BREAST BIOPSY (Right)  Patient location during evaluation: PACU Anesthesia Type: General Level of consciousness: awake and alert Pain management: pain level controlled Vital Signs Assessment: post-procedure vital signs reviewed and stable Respiratory status: spontaneous breathing, nonlabored ventilation and respiratory function stable Cardiovascular status: blood pressure returned to baseline and stable Postop Assessment: no signs of nausea or vomiting Anesthetic complications: no    Last Vitals:  Vitals:   02/07/16 0900 02/07/16 0912  BP: 111/82 127/81  Pulse: 78 76  Resp: 15 14  Temp:      Last Pain:  Vitals:   02/07/16 0912  TempSrc:   PainSc: 2                  Melainie Krinsky,W. EDMOND

## 2016-02-07 NOTE — Anesthesia Procedure Notes (Signed)
Procedure Name: LMA Insertion Date/Time: 02/07/2016 7:32 AM Performed by: Ailana Cuadrado D Pre-anesthesia Checklist: Patient identified, Emergency Drugs available, Suction available and Patient being monitored Patient Re-evaluated:Patient Re-evaluated prior to inductionOxygen Delivery Method: Circle system utilized Preoxygenation: Pre-oxygenation with 100% oxygen Intubation Type: IV induction Ventilation: Mask ventilation without difficulty LMA: LMA inserted LMA Size: 4.0 Number of attempts: 1 Airway Equipment and Method: Bite block Placement Confirmation: positive ETCO2 Tube secured with: Tape Dental Injury: Teeth and Oropharynx as per pre-operative assessment

## 2016-02-07 NOTE — Transfer of Care (Signed)
Immediate Anesthesia Transfer of Care Note  Patient: Darolyn RuaDeborah Castile  Procedure(s) Performed: Procedure(s) with comments: RADIOACTIVE SEED GUIDED EXCISIONAL BREAST BIOPSY (Right) - RADIOACTIVE SEED GUIDED EXCISIONAL BREAST BIOPSY  Patient Location: PACU  Anesthesia Type:General  Level of Consciousness: awake, alert , oriented and patient cooperative  Airway & Oxygen Therapy: Patient Spontanous Breathing and Patient connected to face mask oxygen  Post-op Assessment: Report given to RN and Post -op Vital signs reviewed and stable  Post vital signs: Reviewed and stable  Last Vitals:  Vitals:   02/07/16 0639  BP: 125/70  Pulse: 80  Resp: 18  Temp: 36.8 C    Last Pain:  Vitals:   02/07/16 0639  TempSrc: Oral  PainSc: 3       Patients Stated Pain Goal: 2 (02/07/16 [id number])  Complications: No apparent anesthesia complications

## 2016-02-07 NOTE — Anesthesia Preprocedure Evaluation (Addendum)
Anesthesia Evaluation  Patient identified by MRN, date of birth, ID band Patient awake    Reviewed: Allergy & Precautions, H&P , NPO status , Patient's Chart, lab work & pertinent test results  History of Anesthesia Complications (+) PONV  Airway Mallampati: II  TM Distance: >3 FB Neck ROM: Full    Dental no notable dental hx. (+) Dental Advisory Given, Teeth Intact   Pulmonary neg pulmonary ROS,    Pulmonary exam normal breath sounds clear to auscultation       Cardiovascular hypertension, Pt. on medications  Rhythm:Regular Rate:Normal     Neuro/Psych  Headaches, negative psych ROS   GI/Hepatic negative GI ROS, Neg liver ROS,   Endo/Other  negative endocrine ROS  Renal/GU negative Renal ROS  negative genitourinary   Musculoskeletal  (+) Arthritis , Osteoarthritis,    Abdominal   Peds  Hematology negative hematology ROS (+)   Anesthesia Other Findings   Reproductive/Obstetrics negative OB ROS                            Anesthesia Physical Anesthesia Plan  ASA: II  Anesthesia Plan: General   Post-op Pain Management:    Induction: Intravenous  Airway Management Planned: LMA  Additional Equipment:   Intra-op Plan:   Post-operative Plan: Extubation in OR  Informed Consent: I have reviewed the patients History and Physical, chart, labs and discussed the procedure including the risks, benefits and alternatives for the proposed anesthesia with the patient or authorized representative who has indicated his/her understanding and acceptance.   Dental advisory given  Plan Discussed with: CRNA  Anesthesia Plan Comments:         Anesthesia Quick Evaluation

## 2016-02-07 NOTE — Interval H&P Note (Signed)
History and Physical Interval Note:  02/07/2016 7:06 AM  [name] [name]  has presented today for surgery, with the diagnosis of RIGHT BREAST MASS, ABNORMAL MAMMOGRAM  The various methods of treatment have been discussed with the patient and family. After consideration of risks, benefits and other options for treatment, the patient has consented to  Procedure(s): RADIOACTIVE SEED GUIDED EXCISIONAL BREAST BIOPSY (Right) as a surgical intervention .  The patient's history has been reviewed, patient examined, no change in status, stable for surgery.  I have reviewed the patient's chart and labs.  Questions were answered to the patient's satisfaction.     Koralyn Prestage

## 2016-02-07 NOTE — Discharge Instructions (Signed)
[facility] °Office Phone Number [phone] ° °POST OP INSTRUCTIONS ° °Always review your discharge instruction sheet given to you by the facility where your surgery was performed. ° °IF YOU HAVE DISABILITY OR FAMILY LEAVE FORMS, YOU MUST BRING THEM TO THE OFFICE FOR PROCESSING.  DO NOT GIVE THEM TO YOUR DOCTOR. ° °1. A prescription for pain medication may be given to you upon discharge.  Take your pain medication as prescribed, if needed.  If narcotic pain medicine is not needed, then you may take acetaminophen (Tylenol), naprosyn (Alleve) or ibuprofen (Advil) as needed. °2. Take your usually prescribed medications unless otherwise directed °3. If you need a refill on your pain medication, please contact your pharmacy.  They will contact our office to request authorization.  Prescriptions will not be filled after 5pm or on week-ends. °4. You should eat very light the first 24 hours after surgery, such as soup, crackers, pudding, etc.  Resume your normal diet the day after surgery. °5. Most patients will experience some swelling and bruising in the breast.  Ice packs and a good support bra will help.  Wear the breast binder provided or a sports bra for 72 hours day and night.  After that wear a sports bra during the day until you return to the office. Swelling and bruising can take several days to resolve.  °6. It is common to experience some constipation if taking pain medication after surgery.  Increasing fluid intake and taking a stool softener will usually help or prevent this problem from occurring.  A mild laxative (Milk of Magnesia or Miralax) should be taken according to package directions if there are no bowel movements after 48 hours. °7. Unless discharge instructions indicate otherwise, you may remove your bandages 48 hours after surgery and you may shower at that time.  You may have steri-strips (small skin tapes) in place directly over the incision.  These strips should be left on the  skin for 7-10 days and will come off on their own.  If your surgeon used skin glue on the incision, you may shower in 24 hours.  The glue will flake off over the next 2-3 weeks.  Any sutures or staples will be removed at the office during your follow-up visit. °8. ACTIVITIES:  You may resume regular daily activities (gradually increasing) beginning the next day.  Wearing a good support bra or sports bra minimizes pain and swelling.  You may have sexual intercourse when it is comfortable. °a. You may drive when you no longer are taking prescription pain medication, you can comfortably wear a seatbelt, and you can safely maneuver your car and apply brakes. °b. RETURN TO WORK:  ______________________________________________________________________________________ °9. You should see your doctor in the office for a follow-up appointment approximately two weeks after your surgery.  Your doctor’s nurse will typically make your follow-up appointment when she calls you with your pathology report.  Expect your pathology report 3-4 business days after your surgery.  You may call to check if you do not hear from us after three days. °10. OTHER INSTRUCTIONS: _______________________________________________________________________________________________ _____________________________________________________________________________________________________________________________________ °_____________________________________________________________________________________________________________________________________ °_____________________________________________________________________________________________________________________________________ ° °WHEN TO CALL DR WAKEFIELD: °1. Fever over 101.0 °2. Nausea and/or vomiting. °3. Extreme swelling or bruising. °4. Continued bleeding from incision. °5. Increased pain, redness, or drainage from the incision. ° °The clinic staff is available to answer your questions during regular  business hours.  Please don’t hesitate to call and ask to speak to one of the nurses for clinical concerns.  If   you have a medical emergency, go to the nearest emergency room or call 911.  A surgeon from [facility] is always on call at the hospital. ° °For further questions, please visit [email or web address] ° ° ° °Post Anesthesia Home Care Instructions ° °Activity: °Get plenty of rest for the remainder of the day. A responsible adult should stay with you for 24 hours following the procedure.  °For the next 24 hours, DO NOT: °-Drive a car °-Operate machinery °-Drink alcoholic beverages °-Take any medication unless instructed by your physician °-Make any legal decisions or sign important papers. ° °Meals: °Start with liquid foods such as gelatin or soup. Progress to regular foods as tolerated. Avoid greasy, spicy, heavy foods. If nausea and/or vomiting occur, drink only clear liquids until the nausea and/or vomiting subsides. Call your physician if vomiting continues. ° °Special Instructions/Symptoms: °Your throat may feel dry or sore from the anesthesia or the breathing tube placed in your throat during surgery. If this causes discomfort, gargle with warm salt water. The discomfort should disappear within 24 hours. ° °If you had a scopolamine patch placed behind your ear for the management of post- operative nausea and/or vomiting: ° °1. The medication in the patch is effective for 72 hours, after which it should be removed.  Wrap patch in a tissue and discard in the trash. Wash hands thoroughly with soap and water. °2. You may remove the patch earlier than 72 hours if you experience unpleasant side effects which may include dry mouth, dizziness or visual disturbances. °3. Avoid touching the patch. Wash your hands with soap and water after contact with the patch. °  ° °

## 2016-02-07 NOTE — H&P (Signed)
  89 yof consultation from Dr Annia Beltrew Davis. she has prior radiologic biopsy, multiple callbacks. no family history. c density breasts on mm. there is a subtle distortion in the lateral superior right breast. 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 she has no breast complaints. 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 Foot Surgery Right. Hemorrhoidectomy Oral Surgery Shoulder Surgery Right. Tonsillectomy  Diagnostic Studies History  Colonoscopy never Mammogram within last year Pap Smear 1-5 years ago  Allergies Demerol *ANALGESICS - OPIOID* Lidocaine *CHEMICALS* Tetanus Toxoid Adsorbed *TOXOIDS*  Medication History Lisinopril-Hydrochlorothiazide (10-12.5MG  Tablet, Oral) Active. Medications Reconciled  Social History  Alcohol use Occasional alcohol use. Caffeine use Tea. No drug use Tobacco use Never smoker.  Family History  Arthritis Mother, Sister. Cancer Father. Colon Polyps Mother. Hypertension Brother, Father, Mother, Sister. Melanoma Mother. Migraine Headache Brother, Daughter, Father, Sister. Thyroid problems Father.  Pregnancy / Birth History Age at menarche 12 years. Gravida 4 Irregular periods Length (months) of breastfeeding 7-12 Maternal age 50-25 Para 4  Review of Systems  General Present- Fatigue. Not Present- Appetite Loss, Chills, Fever, Night Sweats, Weight Gain and Weight Loss. Skin Present- Rash. Not Present- Change in Wart/Mole, Dryness, Hives, Jaundice, New Lesions, Non-Healing Wounds and Ulcer. HEENT Present- Wears glasses/contact lenses. Not Present- Earache, Hearing Loss, Hoarseness, Nose Bleed, Oral Ulcers, Ringing in the Ears, Seasonal Allergies, Sinus Pain, Sore Throat, Visual Disturbances and Yellow Eyes. Respiratory Present- Snoring. Not  Present- Bloody sputum, Chronic Cough, Difficulty Breathing and Wheezing. Breast Present- Breast Pain. Not Present- Breast Mass, Nipple Discharge and Skin Changes. Cardiovascular Present- Leg Cramps. Not Present- Chest Pain, Difficulty Breathing Lying Down, Palpitations, Rapid Heart Rate, Shortness of Breath and Swelling of Extremities. Gastrointestinal Present- Excessive gas. Not Present- Abdominal Pain, Bloating, Bloody Stool, Change in Bowel Habits, Chronic diarrhea, Constipation, Difficulty Swallowing, Gets full quickly at meals, Hemorrhoids, Indigestion, Nausea, Rectal Pain and Vomiting. Female Genitourinary Not Present- Frequency, Nocturia, Painful Urination, Pelvic Pain and Urgency. Musculoskeletal Not Present- Back Pain, Joint Pain, Joint Stiffness, Muscle Pain, Muscle Weakness and Swelling of Extremities. Neurological Present- Headaches. Not Present- Decreased Memory, Fainting, Numbness, Seizures, Tingling, Tremor, Trouble walking and Weakness. Psychiatric Not Present- Anxiety, Bipolar, Change in Sleep Pattern, Depression, Fearful and Frequent crying. Endocrine Not Present- Cold Intolerance, Excessive Hunger, Hair Changes, Heat Intolerance, Hot flashes and New Diabetes. Hematology Not Present- Blood Thinners, Easy Bruising, Excessive bleeding, Gland problems, HIV and Persistent Infections.  Vitals  Weight: 196 lb Height: 64.5in Body Surface Area: 1.95 m Body Mass Index: 33.12 kg/m  Pulse: 72 (Regular)  BP: 122/78 (Sitting, Left Arm, Standard)  Physical Exam General Mental Status-Alert. Orientation-Oriented X3. Eye Sclera/Conjunctiva - Bilateral-No scleral icterus. Chest and Lung Exam Chest and lung exam reveals -on auscultation, normal breath sounds, no adventitious sounds and normal vocal resonance. Breast Nipples-No Discharge. Breast Lump-No Palpable Breast Mass. Cardiovascular Cardiovascular examination reveals -normal heart sounds, regular rate and  rhythm with no murmurs. Lymphatic Head & Neck General Head & Neck Lymphatics: Bilateral - Description - Normal. Axillary General Axillary Region: Bilateral - Description - Normal. Note: no [place] adenopathy   Assessment & Plan  ABNORMAL MAMMOGRAM OF RIGHT BREAST (R92.8) Story: Right breast seed guided excisional biopsy we discussed our options including observation vs surgery. she desires to have surgery. we discussed right breast seed guided excision, risks and recovery.

## 2016-02-10 ENCOUNTER — Encounter (HOSPITAL_BASED_OUTPATIENT_CLINIC_OR_DEPARTMENT_OTHER): Payer: Self-pay | Admitting: General Surgery

## 2016-03-02 ENCOUNTER — Encounter: Payer: Self-pay | Admitting: Hematology

## 2016-03-19 ENCOUNTER — Ambulatory Visit (HOSPITAL_BASED_OUTPATIENT_CLINIC_OR_DEPARTMENT_OTHER): Payer: 59 | Admitting: Hematology

## 2016-03-19 ENCOUNTER — Telehealth: Payer: Self-pay | Admitting: Hematology

## 2016-03-19 ENCOUNTER — Encounter: Payer: Self-pay | Admitting: Hematology

## 2016-03-19 VITALS — BP 129/55 | HR 80 | Temp 98.5°F | Resp 18 | Ht 64.0 in | Wt 195.1 lb

## 2016-03-19 DIAGNOSIS — N6091 Unspecified benign mammary dysplasia of right breast: Secondary | ICD-10-CM | POA: Diagnosis not present

## 2016-03-19 DIAGNOSIS — Z79811 Long term (current) use of aromatase inhibitors: Secondary | ICD-10-CM | POA: Diagnosis not present

## 2016-03-19 MED ORDER — TAMOXIFEN CITRATE 20 MG PO TABS
20.0000 mg | ORAL_TABLET | Freq: Every day | ORAL | 2 refills | Status: DC
Start: 2016-03-19 — End: 2021-09-01

## 2016-03-19 NOTE — Progress Notes (Signed)
[facility]  [phone] Fax:([phone]  Clinic New Consult Note   Patient Care Team: Johny Blamer, MD as PCP - General (Family Medicine) 03/19/2016  REFERRING PHYSICIAN: Dr. Dwain Sarna   CHIEF COMPLAINTS/PURPOSE OF CONSULTATION:  Right breast ADH   HISTORY OF PRESENTING ILLNESS:  [name] [name] 50 y.o. female is here because of her recently diagnosed right breast ADH. She was referred by her breast surgeon Dr. Dwain Sarna to discuss breast cancer risk reduction.   She was found to have distortion in the superior lateral right breast on screening mammogram last year, and diagnostic mammogram and ultrasound showed benign calcification, so a diagnostic mammogram was recommended for this year, which showed persistent subtle distortion in the superior lateral right breast, so she underwent ultrasound-guided biopsy, which showed ADH. She was referred to breast surgeon Dr. Dwain Sarna, and underwent right breast lumpectomy on 01/18/2016. She tolerated the surgery very well. She has recovered completely.   She has lumbar spine disc herniation, and has intermittent sciatica pain. She otherwise feels well, has good appetite and energy level, no other complaints. Her family history is positive for breast cancer in her cousin, who had at age of 68. Her maternal grandfather had pancreatic cancer, maternal grandmother had lung cancer, mother had melanoma, and father had thyroid cancer.  GYN HISTORY  Menarchal: 12 LMP: 03/15/2016  Contraceptive: none  HRT: n/a  G4P4: 2 sons and 2 daughters age 10-26 yo, first child age of 88      MEDICAL HISTORY:  Past Medical History:  Diagnosis Date  . Arthritis    right shoulder  . Breast mass, right 01/2016  . Cerebral aneurysm    inferior cavernous sinus; is followed by [facility]  . Dental crown present    left upper molar  . Hypertension    under control; has been on med. x 20 yr.  . Migraines   . PONV  (postoperative nausea and vomiting)     SURGICAL HISTORY: Past Surgical History:  Procedure Laterality Date  . BUNIONECTOMY Right   . RADIOACTIVE SEED GUIDED EXCISIONAL BREAST BIOPSY Right 02/07/2016   Procedure: RADIOACTIVE SEED GUIDED EXCISIONAL BREAST BIOPSY;  Surgeon: Emelia Loron, MD;  Location: [facility];  Service: General;  Laterality: Right;  RADIOACTIVE SEED GUIDED EXCISIONAL BREAST BIOPSY  . SHOULDER ARTHROSCOPY  04/30/2011   Procedure: ARTHROSCOPY SHOULDER;  Surgeon: Loreta Ave, MD;  Location: [facility];  Service: Orthopedics;  Laterality: Right;  right shoulder scope, distal claviculectomy,decompression subacromial partial acromioplasty with coracoacromial release  . TONSILLECTOMY AND ADENOIDECTOMY  as a child    SOCIAL HISTORY: Social History   Social History  . Marital status: Married    Spouse name: N/A  . Number of children: N/A  . Years of education: N/A   Occupational History  . Not on file.   Social History Main Topics  . Smoking status: Never Smoker  . Smokeless tobacco: Never Used  . Alcohol use Yes     Comment: occasionally  . Drug use: No  . Sexual activity: Not on file   Other Topics Concern  . Not on file   Social History Narrative  . No narrative on file    FAMILY HISTORY: Family History  Problem Relation Age of Onset  . Anesthesia problems Son     post-op nausea    ALLERGIES:  is allergic to demerol; eggs or egg-derived products; lidocaine; influenza vaccine live; tape; and tetanus toxoids.  MEDICATIONS:  Current Outpatient Prescriptions  Medication Sig Dispense Refill  . HYDROcodone-acetaminophen (NORCO) 10-325 MG tablet Take 1 tablet by mouth every 6 (six) hours as needed. 10 tablet 0  . lisinopril-hydrochlorothiazide (PRINZIDE,ZESTORETIC) 10-12.5 MG per tablet Take 1 tablet by mouth daily.      No current facility-administered medications for this visit.     REVIEW OF SYSTEMS:     Constitutional: Denies fevers, chills or abnormal night sweats Eyes: Denies blurriness of vision, double vision or watery eyes Ears, nose, mouth, throat, and face: Denies mucositis or sore throat Respiratory: Denies cough, dyspnea or wheezes Cardiovascular: Denies palpitation, chest discomfort or lower extremity swelling Gastrointestinal:  Denies nausea, heartburn or change in bowel habits Skin: Denies abnormal skin rashes Lymphatics: Denies new lymphadenopathy or easy bruising Neurological:Denies numbness, tingling or new weaknesses Behavioral/Psych: Mood is stable, no new changes  All other systems were reviewed with the patient and are negative.  PHYSICAL EXAMINATION: ECOG PERFORMANCE STATUS: 0 - Asymptomatic  Vitals:   03/19/16 1454  BP: (!) 129/55  Pulse: 80  Resp: 18  Temp: 98.5 F (36.9 C)   Filed Weights   03/19/16 1454  Weight: 195 lb 1.6 oz (88.5 kg)    GENERAL:alert, no distress and comfortable SKIN: skin color, texture, turgor are normal, no rashes or significant lesions EYES: normal, conjunctiva are pink and non-injected, sclera clear OROPHARYNX:no exudate, no erythema and lips, buccal mucosa, and tongue normal  NECK: supple, thyroid normal size, non-tender, without nodularity LYMPH:  no palpable lymphadenopathy in the cervical, axillary or inguinal LUNGS: clear to auscultation and percussion with normal breathing effort HEART: regular rate & rhythm and no murmurs and no lower extremity edema ABDOMEN:abdomen soft, non-tender and normal bowel sounds Musculoskeletal:no cyanosis of digits and no clubbing  PSYCH: alert & oriented x 3 with fluent speech NEURO: no focal motor/sensory deficits Breasts: Breast inspection showed them to be symmetrical with no nipple discharge. Palpation of the breasts and axilla revealed no obvious mass that I could appreciate. The incision in right breast has well healed    LABORATORY DATA:  I have reviewed the data as listed CBC  Latest Ref Rng & Units 05/22/2011 04/30/2011  WBC 4.0 - 10.5 K/uL 8.9 -  Hemoglobin 12.0 - 15.0 g/dL 46.913.9 62.913.9  Hematocrit 52.836.0 - 46.0 % 40.1 -  Platelets 150 - 400 K/uL 305 -   CMP Latest Ref Rng & Units 02/06/2016 05/22/2011 04/29/2011  Glucose 65 - 99 mg/dL 92 413(K117(H) 440(N103(H)  BUN 6 - 20 mg/dL 11 12 7   Creatinine 0.44 - 1.00 mg/dL 0.270.65 2.530.60 6.640.64  Sodium 135 - 145 mmol/L 136 138 138  Potassium 3.5 - 5.1 mmol/L 3.8 3.7 4.3  Chloride 101 - 111 mmol/L 104 101 100  CO2 22 - 32 mmol/L 24 25 29   Calcium 8.9 - 10.3 mg/dL 9.5 9.7 9.8  Total Protein 6.0 - 8.3 g/dL - 7.5 -  Total Bilirubin 0.3 - 1.2 mg/dL - 4.0(H0.2(L) -  Alkaline Phos 39 - 117 U/L - 69 -  AST 0 - 37 U/L - 19 -  ALT 0 - 35 U/L - 21 -   PATHOLOGY REPORT  Diagnosis 01/18/2016 Breast, lumpectomy, Right - ATYPICAL DUCTAL HYPERPLASIA WITH CALCIFICATIONS. - FIBROCYSTIC CHANGE WITH ADENOSIS AND CALCIFICATIONS. - PSEUDOANGIOMATOUS STROMAL HYPERPLASIA (PASH) - HEALING BIOPSY SITE. - SEE COMMENT. Microscopic Comment The surgical resection margin(s) of the specimen were inked and microscopically evaluated. (JBK:kh 02-10-16)  RADIOGRAPHIC STUDIES: I have personally reviewed the radiological images as listed and agreed with the findings in  the report. No results found.  ASSESSMENT & PLAN:  50 year old premenopausal Caucasian female  #1 right breast atypical lobular hyperplasia -I discussed her imaging findings and surgical pathology results with her in great details. -We reviewed the natural history of atypical hyperplasia. It is consider a benign breast disease, however it does increase the risk of breast cancer by 3-5 fold. It is considered as a high risk for breast cancer. -We discussed annual screening mammogram, which will detect early stage breast cancer. She agrees to continue. She is very compliant on screening -We also discussed healthy diet and regular exercise, calcium and vitamin D supplement, to reduce her risk of breast  cancer -We further discussed chemoprevention to breast cancer. She is premenopausal, cells only option is tamoxifen, which is likely going to reduce the risk of breast cancer by 30-40%, however there is no data of survival benefit so far.  - The potential side effects, which includes but not limited to, hot flash, skin and vaginal dryness, slightly increased risk of cardiovascular disease and cataract, small risk of thrombosis and endometrial cancer, were discussed with her in great details. Preventive strategies for thrombosis, such as being physically active, using compression stocks, avoid cigarette smoking, etc., were reviewed with her. I also recommend her to follow-up with her gynecologist once a year, and watch for vaginal spotting or bleeding, as a clinically sign of endometrial cancer, etc. She voiced good understanding, and agrees to proceed.  -I called in tamoxifen to her pharmacy today, she will start in a few days.  #2 Bone health -I discussed tamoxifen and raloxifene can increased her bone density.  #3. Genetics -she has multiple family members with breast, pancreas, thyroid cancdr and melanoma, meets the criteria for genetic testing to rule out inheritable cancer syndrome -I will refer her to genetics, she is interested   Follow-up: I plan to see her back in 2 month forl follow-up, she was started tamoxifen in a few days. Genetics referral.    All questions were answered. The patient knows to call the clinic with any problems, questions or concerns. I spent 55 minutes counseling the patient face to face. The total time spent in the appointment was 60 minutes and more than 50% was on counseling.     Malachy MoodFeng, Dequante Tremaine, MD 03/19/2016 3:12 PM

## 2016-03-19 NOTE — Telephone Encounter (Signed)
Appointments scheduled per 11/9 LOS. Patient given AVS report and calendars with future scheduled appointments.  Lab closed at 4PM on 11/9 Lab appointment scheduled for 11/10 in AM instead. Patient requested to he appointment scheduled for 05/14/16

## 2016-03-20 ENCOUNTER — Other Ambulatory Visit: Payer: 59

## 2016-03-20 ENCOUNTER — Other Ambulatory Visit: Payer: Self-pay | Admitting: Hematology

## 2016-03-20 DIAGNOSIS — N6091 Unspecified benign mammary dysplasia of right breast: Secondary | ICD-10-CM

## 2016-03-22 ENCOUNTER — Encounter: Payer: Self-pay | Admitting: Hematology

## 2016-04-08 ENCOUNTER — Telehealth: Payer: Self-pay | Admitting: *Deleted

## 2016-04-08 NOTE — Telephone Encounter (Signed)
Patient called and left message.  She saw Dr. Mosetta PuttFeng several weeks ago.  She would like to talk with her about tamoxifen and a few other things they discussed.  Please call her at [phone].

## 2016-04-08 NOTE — Telephone Encounter (Signed)
I called back and left a VM: will try to call her again later today, or she can call us back.   Malachy MoodYan Bhavya Eschete MD

## 2016-04-09 NOTE — Telephone Encounter (Signed)
I spoke with pt this morning. She will see her GYN then start Tamoxifen afterwards.   Malachy MoodYan Kamry Faraci MD

## 2016-04-15 ENCOUNTER — Other Ambulatory Visit: Payer: Self-pay | Admitting: Obstetrics and Gynecology

## 2016-04-15 ENCOUNTER — Other Ambulatory Visit (HOSPITAL_COMMUNITY)
Admission: RE | Admit: 2016-04-15 | Discharge: 2016-04-15 | Disposition: A | Discharge status: Home / Self Care | Payer: 59 | Source: Ambulatory Visit | Attending: Obstetrics and Gynecology | Admitting: Obstetrics and Gynecology

## 2016-04-15 DIAGNOSIS — Z1151 Encounter for screening for human papillomavirus (HPV): Secondary | ICD-10-CM | POA: Insufficient documentation

## 2016-04-16 LAB — CERVICOVAGINAL ANCILLARY ONLY: HPV: NOT DETECTED

## 2016-04-20 ENCOUNTER — Other Ambulatory Visit: Payer: 59

## 2016-05-13 ENCOUNTER — Telehealth: Payer: Self-pay | Admitting: Hematology

## 2016-05-13 NOTE — Telephone Encounter (Signed)
Appointments rescheduled per patient request. Patient requested to have 1/4 appointment rescheduled to 3/1 and to r/s lab appointment to 1/8.

## 2016-05-14 ENCOUNTER — Ambulatory Visit: Payer: 59 | Admitting: Hematology

## 2016-05-18 ENCOUNTER — Other Ambulatory Visit: Payer: 59

## 2016-07-09 ENCOUNTER — Ambulatory Visit: Payer: 59 | Admitting: Hematology

## 2016-07-24 ENCOUNTER — Other Ambulatory Visit: Payer: Self-pay | Admitting: Obstetrics and Gynecology

## 2017-06-08 ENCOUNTER — Other Ambulatory Visit: Payer: Self-pay | Admitting: Family Medicine

## 2017-06-08 DIAGNOSIS — I671 Cerebral aneurysm, nonruptured: Secondary | ICD-10-CM

## 2017-06-26 ENCOUNTER — Ambulatory Visit
Admission: RE | Admit: 2017-06-26 | Discharge: 2017-06-26 | Disposition: A | Discharge status: Home / Self Care | Payer: 59 | Source: Ambulatory Visit | Attending: Family Medicine | Admitting: Family Medicine

## 2017-06-26 ENCOUNTER — Other Ambulatory Visit: Payer: 59

## 2017-06-26 DIAGNOSIS — I671 Cerebral aneurysm, nonruptured: Secondary | ICD-10-CM

## 2018-04-20 ENCOUNTER — Other Ambulatory Visit: Payer: Self-pay | Admitting: Family Medicine

## 2018-04-20 DIAGNOSIS — Z1231 Encounter for screening mammogram for malignant neoplasm of breast: Secondary | ICD-10-CM

## 2018-06-02 ENCOUNTER — Ambulatory Visit
Admission: RE | Admit: 2018-06-02 | Discharge: 2018-06-02 | Disposition: A | Discharge status: Home / Self Care | Payer: 59 | Source: Ambulatory Visit | Attending: Family Medicine | Admitting: Family Medicine

## 2018-06-02 DIAGNOSIS — Z1231 Encounter for screening mammogram for malignant neoplasm of breast: Secondary | ICD-10-CM

## 2019-01-08 ENCOUNTER — Ambulatory Visit (HOSPITAL_COMMUNITY)
Admission: EM | Admit: 2019-01-08 | Discharge: 2019-01-08 | Disposition: A | Discharge status: Home / Self Care | Payer: Worker's Compensation | Attending: Family Medicine | Admitting: Family Medicine

## 2019-01-08 ENCOUNTER — Other Ambulatory Visit: Payer: Self-pay

## 2019-01-08 ENCOUNTER — Encounter (HOSPITAL_COMMUNITY): Payer: Self-pay | Admitting: Emergency Medicine

## 2019-01-08 DIAGNOSIS — W108XXA Fall (on) (from) other stairs and steps, initial encounter: Secondary | ICD-10-CM

## 2019-01-08 DIAGNOSIS — S81012A Laceration without foreign body, left knee, initial encounter: Secondary | ICD-10-CM

## 2019-01-08 DIAGNOSIS — Z23 Encounter for immunization: Secondary | ICD-10-CM | POA: Diagnosis not present

## 2019-01-08 MED ORDER — TETANUS-DIPHTH-ACELL PERTUSSIS 5-2.5-18.5 LF-MCG/0.5 IM SUSP
0.5000 mL | Freq: Once | INTRAMUSCULAR | Status: AC
  Administered 2019-01-08: 18:00:00 0.5 mL via INTRAMUSCULAR

## 2019-01-08 MED ORDER — TETANUS-DIPHTH-ACELL PERTUSSIS 5-2.5-18.5 LF-MCG/0.5 IM SUSP
INTRAMUSCULAR | Status: AC
  Filled 2019-01-08: qty 0.5

## 2019-01-08 MED ORDER — BUPIVACAINE HCL (PF) 0.5 % IJ SOLN
INTRAMUSCULAR | Status: AC
  Filled 2019-01-08: qty 10

## 2019-01-08 NOTE — ED Triage Notes (Signed)
Pt here with laceration to left knee after falling today

## 2019-01-08 NOTE — ED Provider Notes (Signed)
[facility]    CSN: [id number] Arrival date & time: 01/08/19  1753      History   Chief Complaint Chief Complaint  Patient presents with  . Laceration    HPI [name] Sallee is a 53 y.o. female.   HPI  Patient tripped going down stairs and landed on her left knee.  [name] [name] has a laceration across the knee that needs to be repaired.  Very little pain.  [name] [name] can ambulate without difficulty.  [name] [name] is due for a tetanus.  Past Medical History:  Diagnosis Date  . Arthritis    right shoulder  . Breast mass, right 01/2016  . Cerebral aneurysm    inferior cavernous sinus; is followed by [facility]  . Dental crown present    left upper molar  . Hypertension    under control; has been on med. x 20 yr.  . Migraines   . PONV (postoperative nausea and vomiting)     Patient Active Problem List   Diagnosis Date Noted  . Atypical ductal hyperplasia of right breast 03/20/2016    Past Surgical History:  Procedure Laterality Date  . BREAST EXCISIONAL BIOPSY Right    benign  . BUNIONECTOMY Right   . RADIOACTIVE SEED GUIDED EXCISIONAL BREAST BIOPSY Right 02/07/2016   Procedure: RADIOACTIVE SEED GUIDED EXCISIONAL BREAST BIOPSY;  Surgeon: Emelia Loron, MD;  Location: [facility];  Service: General;  Laterality: Right;  RADIOACTIVE SEED GUIDED EXCISIONAL BREAST BIOPSY  . SHOULDER ARTHROSCOPY  04/30/2011   Procedure: ARTHROSCOPY SHOULDER;  Surgeon: Loreta Ave, MD;  Location: [facility];  Service: Orthopedics;  Laterality: Right;  right shoulder scope, distal claviculectomy,decompression subacromial partial acromioplasty with coracoacromial release  . TONSILLECTOMY AND ADENOIDECTOMY  as a child    OB History   No obstetric history on file.      Home Medications    Prior to Admission medications   Medication Sig Start Date End Date Taking? Authorizing Provider  diphenhydrAMINE (BENADRYL) 50 MG tablet Take 50 mg by mouth at bedtime as  needed for itching.    [provider]  lisinopril-hydrochlorothiazide (PRINZIDE,ZESTORETIC) 10-12.5 MG per tablet Take 1 tablet by mouth daily.     [provider]  tamoxifen (NOLVADEX) 20 MG tablet Take 1 tablet (20 mg total) by mouth daily. 03/19/16   Malachy Mood, MD    Family History Family History  Problem Relation Age of Onset  . Anesthesia problems Son        post-op nausea  . Cancer Mother        malenoma   . Cancer Father 46       thyroid cancer   . Cancer Maternal Grandmother        lung cancer   . Cancer Maternal Grandfather        pancreatic cancer   . Cancer Cousin 50       breast cancer     Social History Social History   Tobacco Use  . Smoking status: Never Smoker  . Smokeless tobacco: Never Used  Substance Use Topics  . Alcohol use: Yes    Comment: occasionally  . Drug use: No     Allergies   Demerol, Eggs or egg-derived products, Lidocaine, Influenza vaccine live, Tape, and Tetanus toxoids   Review of Systems Review of Systems  Constitutional: Negative for chills and fever.  HENT: Negative for ear pain and sore throat.   Eyes: Negative for pain and visual disturbance.  Respiratory:  Negative for cough and shortness of breath.   Cardiovascular: Negative for chest pain and palpitations.  Gastrointestinal: Negative for abdominal pain and vomiting.  Genitourinary: Negative for dysuria and hematuria.  Musculoskeletal: Negative for arthralgias and back pain.  Skin: Positive for wound. Negative for color change and rash.  Neurological: Negative for seizures and syncope.  All other systems reviewed and are negative.    Physical Exam Triage Vital Signs ED Triage Vitals [01/08/19 1804]  Enc Vitals Group     BP (!) 142/99     Pulse Rate 97     Resp 18     Temp 98.3 F (36.8 C)     Temp Source Oral     SpO2 98 %     Weight      Height      Head Circumference      Peak Flow      Pain Score 5     Pain Loc      Pain Edu?       Excl. in GC?    No data found.  Updated Vital Signs BP (!) 142/99 (BP Location: Right Arm)   Pulse 97   Temp 98.3 F (36.8 C) (Oral)   Resp 18   SpO2 98%     Physical Exam Constitutional:      General: [name] [name] is not in acute distress.    Appearance: [name] [name] is well-developed and normal weight.  HENT:     Head: Normocephalic and atraumatic.  Eyes:     Conjunctiva/sclera: Conjunctivae normal.     Pupils: Pupils are equal, round, and reactive to light.  Neck:     Musculoskeletal: Normal range of motion.  Cardiovascular:     Rate and Rhythm: Normal rate.  Pulmonary:     Effort: Pulmonary effort is normal. No respiratory distress.  Abdominal:     General: There is no distension.     Palpations: Abdomen is soft.  Musculoskeletal: Normal range of motion.       Legs:  Skin:    General: Skin is warm and dry.  Neurological:     Mental Status: [name] [name] is alert.      UC Treatments / Results  Labs (all labs ordered are listed, but only abnormal results are displayed) Labs Reviewed - No data to display  EKG   Radiology No results found.  Procedures Laceration Repair  Date/Time: 01/08/2019 6:40 PM Performed by: Eustace MooreNelson, Malayzia Laforte Sue, MD Authorized by: Eustace MooreNelson, Ernesteen Mihalic Sue, MD   Consent:    Consent obtained:  Verbal   Consent given by:  Patient   Risks discussed:  Infection, need for additional repair, poor cosmetic result and poor wound healing   Alternatives discussed:  No treatment Anesthesia (see MAR for exact dosages):    Anesthesia method:  Local infiltration   Local anesthetic:  Bupivacaine 0.25% w/o epi Laceration details:    Location:  Leg   Leg location:  L knee   Length (cm):  2.5   Depth (mm):  0.5 Repair type:    Repair type:  Simple Pre-procedure details:    Preparation:  Patient was prepped and draped in usual sterile fashion Exploration:    Wound exploration: wound explored through full range of motion and entire depth of wound probed and visualized    Treatment:    Area cleansed with:  Betadine   Amount of cleaning:  Extensive   Irrigation solution:  Sterile saline   Irrigation volume:  20 cc   Irrigation method:  Syringe   Visualized foreign bodies/material removed: no   Skin repair:    Repair method:  Sutures   Suture size:  3-0   Suture material:  Prolene   Suture technique:  Simple interrupted   Number of sutures:  3 Post-procedure details:    Dressing:  Antibiotic ointment and adhesive bandage   Patient tolerance of procedure:  Tolerated well, no immediate complications   (including critical care time)  Medications Ordered in UC Medications  Tdap (BOOSTRIX) injection 0.5 mL (0.5 mLs Intramuscular Given 01/08/19 1813)  bupivacaine (MARCAINE) 0.5 % injection (has no administration in time range)  Tdap (BOOSTRIX) 5-2.5-18.5 LF-MCG/0.5 injection (has no administration in time range)    Initial Impression / Assessment and Plan / UC Course  I have reviewed the triage vital signs and the nursing notes.  Pertinent labs & imaging results that were available during my care of the patient were reviewed by me and considered in my medical decision making (see chart for details).     Patient stated [name] [name] was allergic to tetanus, local allergic reaction.  We did give her the tetanus anyway, put an ice pack on it, told her to take a Benadryl when [name] [name] went home.  Patient states [name] [name] is allergic to lidocaine because it makes her skin blister but [name] [name] can use other "canes".  I used 0.5% bupivacaine and then buffered it with normal saline. Final Clinical Impressions(s) / UC Diagnoses   Final diagnoses:  Laceration of left knee, initial encounter     Discharge Instructions     Keep area clean and reasonably dry May become briefly wet Wash once or twice a day and apply a Band-Aid with a small amount of antibiotic ointment Watch for infection.  Call or return if you see redness, pus, streaks, or increasing pain Sutures need to come out in  10 days Put ice on area to reduce pain and swelling   ED Prescriptions    None     Controlled Substance Prescriptions [place] Controlled Substance Registry consulted? Not Applicable   Raylene Everts, MD 01/08/19 [id number]

## 2019-01-08 NOTE — Discharge Instructions (Addendum)
Keep area clean and reasonably dry May become briefly wet Wash once or twice a day and apply a Band-Aid with a small amount of antibiotic ointment Watch for infection.  Call or return if you see redness, pus, streaks, or increasing pain Sutures need to come out in 10 days Put ice on area to reduce pain and swelling

## 2019-01-18 ENCOUNTER — Ambulatory Visit (HOSPITAL_COMMUNITY)
Admission: EM | Admit: 2019-01-18 | Discharge: 2019-01-18 | Disposition: A | Discharge status: Home / Self Care | Payer: Worker's Compensation

## 2019-01-18 ENCOUNTER — Other Ambulatory Visit: Payer: Self-pay

## 2019-01-18 ENCOUNTER — Encounter (HOSPITAL_COMMUNITY): Payer: Self-pay | Admitting: Emergency Medicine

## 2019-01-18 NOTE — ED Triage Notes (Signed)
Left knee with sutured wound.  Patient here for suture removal.

## 2019-04-21 ENCOUNTER — Other Ambulatory Visit: Payer: Self-pay | Admitting: Family Medicine

## 2019-04-21 DIAGNOSIS — Z1231 Encounter for screening mammogram for malignant neoplasm of breast: Secondary | ICD-10-CM

## 2019-05-23 ENCOUNTER — Ambulatory Visit: Payer: 59 | Attending: Internal Medicine

## 2019-05-23 DIAGNOSIS — Z20822 Contact with and (suspected) exposure to covid-19: Secondary | ICD-10-CM

## 2019-05-24 LAB — NOVEL CORONAVIRUS, NAA: SARS-CoV-2, NAA: NOT DETECTED

## 2019-06-08 ENCOUNTER — Ambulatory Visit: Payer: Self-pay

## 2019-08-15 ENCOUNTER — Inpatient Hospital Stay: Admission: RE | Admit: 2019-08-15 | Payer: Self-pay | Source: Ambulatory Visit

## 2019-08-25 ENCOUNTER — Inpatient Hospital Stay: Admission: RE | Admit: 2019-08-25 | Payer: Self-pay | Source: Ambulatory Visit

## 2019-08-28 ENCOUNTER — Ambulatory Visit: Payer: Self-pay

## 2019-12-28 ENCOUNTER — Encounter: Payer: Self-pay | Admitting: Plastic Surgery

## 2019-12-28 ENCOUNTER — Ambulatory Visit (INDEPENDENT_AMBULATORY_CARE_PROVIDER_SITE_OTHER): Payer: 59 | Admitting: Plastic Surgery

## 2019-12-28 ENCOUNTER — Other Ambulatory Visit: Payer: Self-pay

## 2019-12-28 VITALS — BP 126/69 | HR 75 | Temp 98.7°F | Ht 64.0 in | Wt 205.0 lb

## 2019-12-28 DIAGNOSIS — L989 Disorder of the skin and subcutaneous tissue, unspecified: Secondary | ICD-10-CM | POA: Diagnosis not present

## 2019-12-28 NOTE — Progress Notes (Signed)
Referring Provider Johny Blamer, MD [address] [place],  [place] [address]   CC:  Chief Complaint  Patient presents with  . Advice Only      [name] [name] is an 54 y.o. female.  HPI: Patient is here to discuss multiple skin lesions.  She has a cyst on her scalp that is been present for a number of years and is growing and is symptomatic for her.  It is intermittently tender.  It has not drained yet.  There is also a cystic lesion on her lower back that bothers her when she lays on it and will catch on things.  She also has a small erythematous lesion in the right upper lip that is getting larger in size and she caught it the other day and caused it to bleed a little bit.  She would like all these to be excised.  Allergies  Allergen Reactions  . Demerol Nausea And Vomiting  . Eggs Or Egg-Derived Products Nausea Only  . Lidocaine Swelling and Other (See Comments)    SKIN REDNESS, BLISTERS  . Influenza Vaccine Live Swelling and Other (See Comments)    LOCAL REACTION AT SITE  . Tape Rash  . Tetanus Toxoids Other (See Comments)    LOCAL REACTION AT SITE    Outpatient Encounter Medications as of 12/28/2019  Medication Sig  . Cholecalciferol (VITAMIN D) 50 MCG (2000 UT) tablet Take 2,000 Units by mouth daily.  . diphenhydrAMINE HCl (ALLERGY MEDICATION PO) Take by mouth. Take as needed.  . Doxylamine Succinate, Sleep, (SLEEP AID PO) Take by mouth. Take as needed  . lisinopril-hydrochlorothiazide (PRINZIDE,ZESTORETIC) 10-12.5 MG per tablet Take 1 tablet by mouth daily.   . meloxicam (MOBIC) 15 MG tablet Take 15 mg by mouth daily.  . Multiple Vitamin (MULTI-VITAMIN DAILY PO) Take by mouth. Take 1 daily  . diphenhydrAMINE (BENADRYL) 50 MG tablet Take 50 mg by mouth at bedtime as needed for itching.  . tamoxifen (NOLVADEX) 20 MG tablet Take 1 tablet (20 mg total) by mouth daily.   No facility-administered encounter medications on file as of 12/28/2019.     Past  Medical History:  Diagnosis Date  . Arthritis    right shoulder  . Breast mass, right 01/2016  . Cerebral aneurysm    inferior cavernous sinus; is followed by [facility]  . Dental crown present    left upper molar  . Hypertension    under control; has been on med. x 20 yr.  . Migraines   . PONV (postoperative nausea and vomiting)     Past Surgical History:  Procedure Laterality Date  . BREAST EXCISIONAL BIOPSY Right    benign  . BUNIONECTOMY Right   . RADIOACTIVE SEED GUIDED EXCISIONAL BREAST BIOPSY Right 02/07/2016   Procedure: RADIOACTIVE SEED GUIDED EXCISIONAL BREAST BIOPSY;  Surgeon: Emelia Loron, MD;  Location: [facility];  Service: General;  Laterality: Right;  RADIOACTIVE SEED GUIDED EXCISIONAL BREAST BIOPSY  . SHOULDER ARTHROSCOPY  04/30/2011   Procedure: ARTHROSCOPY SHOULDER;  Surgeon: Loreta Ave, MD;  Location: [facility];  Service: Orthopedics;  Laterality: Right;  right shoulder scope, distal claviculectomy,decompression subacromial partial acromioplasty with coracoacromial release  . TONSILLECTOMY AND ADENOIDECTOMY  as a child    Family History  Problem Relation Age of Onset  . Anesthesia problems Son        post-op nausea  . Cancer Mother        malenoma   .  Cancer Father 58       thyroid cancer   . Cancer Maternal Grandmother        lung cancer   . Cancer Maternal Grandfather        pancreatic cancer   . Cancer Cousin 50       breast cancer     Social History   Social History Narrative  . Not on file     Review of Systems General: Denies fevers, chills, weight loss CV: Denies chest pain, shortness of breath, palpitations  Physical Exam Vitals with BMI 12/28/2019 01/08/2019 03/19/2016  Height 5\' 4"  - 5\' 4"   Weight 205 lbs - 195 lbs 2 oz  BMI 35.17 - 33.6  Systolic 126 142  Diastolic 69 99 55  Pulse 75 97 80    General:  No acute distress,  Alert and oriented, Non-Toxic, Normal speech and  affect Examination of the scalp shows a 2 cm cystic lesion right at the vertex close to the midline.  The overlying skin is thinning slightly. Examination of the back shows a 2.5 cm cystic lesion that subcutaneous with thinning overlying skin. Examination of the upper lip shows a 5 mm erythematous ulcerative lesion to the right of midline on the skin portion of the lip.  It has fairly well-defined borders.  Assessment/Plan Patient has 3 symptomatic skin lesions that she would like to be excised.  We discussed excision under local.  We discussed the risk that include bleeding, infection, damage to surrounding structures, need for additional procedures.  She is fully understanding we will plan to set this up for her.  12/28/2019, 12:11 PM

## 2020-01-26 ENCOUNTER — Ambulatory Visit
Admission: RE | Admit: 2020-01-26 | Discharge: 2020-01-26 | Disposition: A | Discharge status: Home / Self Care | Payer: 59 | Source: Ambulatory Visit | Attending: Family Medicine | Admitting: Family Medicine

## 2020-01-26 ENCOUNTER — Other Ambulatory Visit: Payer: Self-pay

## 2020-01-26 DIAGNOSIS — Z1231 Encounter for screening mammogram for malignant neoplasm of breast: Secondary | ICD-10-CM

## 2020-02-15 ENCOUNTER — Ambulatory Visit (INDEPENDENT_AMBULATORY_CARE_PROVIDER_SITE_OTHER): Payer: 59 | Admitting: Plastic Surgery

## 2020-02-15 ENCOUNTER — Other Ambulatory Visit (HOSPITAL_COMMUNITY)
Admission: RE | Admit: 2020-02-15 | Discharge: 2020-02-15 | Disposition: A | Discharge status: Home / Self Care | Payer: 59 | Source: Ambulatory Visit | Attending: Plastic Surgery | Admitting: Plastic Surgery

## 2020-02-15 ENCOUNTER — Encounter: Payer: Self-pay | Admitting: Plastic Surgery

## 2020-02-15 ENCOUNTER — Other Ambulatory Visit: Payer: Self-pay

## 2020-02-15 VITALS — BP 110/76 | HR 79 | Temp 98.4°F

## 2020-02-15 DIAGNOSIS — L989 Disorder of the skin and subcutaneous tissue, unspecified: Secondary | ICD-10-CM

## 2020-02-15 NOTE — Progress Notes (Signed)
Operative Note   DATE OF OPERATION: 02/15/2020  LOCATION:    SURGICAL DEPARTMENT: Plastic Surgery  PREOPERATIVE DIAGNOSES: Scalp and right lower back cysts.  Right upper lip skin lesion  POSTOPERATIVE DIAGNOSES:  same  PROCEDURE:  1. Excision of scalp cyst measuring 2.5 cm 2. Complex closure scalp measuring 2.5 cm 3. Excision of right lower back cyst measuring 3 cm 4. Complex closure right lower back measuring 3 cm 5. 4 mm punch excision of right upper lip skin lesion with simple closure  SURGEON: Ancil Linsey, MD  ANESTHESIA:  Local  COMPLICATIONS: None.   INDICATIONS FOR PROCEDURE:  The patient, Victoriya Pol is a 54 y.o. female born on 1966/04/22, is here for treatment of multiple skin lesions MRN: [id number]  CONSENT:  Informed consent was obtained directly from the patient. Risks, benefits and alternatives were fully discussed. Specific risks including but not limited to bleeding, infection, hematoma, seroma, scarring, pain, infection, wound healing problems, and need for further surgery were all discussed. The patient did have an ample opportunity to have questions answered to satisfaction.   DESCRIPTION OF PROCEDURE:  Local anesthesia was administered. The patient's operative site was prepped and draped in a sterile fashion. A time out was performed and all information was confirmed to be correct.  The scalp and back lesions were excised with a 15 blade. Hemostasis was obtained.  Circumferential undermining was performed and the skin was advanced and closed in layers with interrupted buried Monocryl sutures and 3-0 Monocryl for the skin.    Right upper lip lesion was excised with a 4 mm punch and scissors.  Hemostasis was obtained and skin was closed primarily with 2 interrupted 5-0 fast gut sutures.  The patient tolerated the procedure well.  There were no complications.

## 2020-02-21 LAB — SURGICAL PATHOLOGY

## 2020-02-28 NOTE — Progress Notes (Signed)
   Subjective:     Patient ID: [name] [name], female    DOB: 08-Apr-1966, 54 y.o.   MRN: [id number]  Chief Complaint  Patient presents with  . Follow-up    HPI: The patient is a 54 y.o. female here for follow-up after undergoing excision of scalp cyst (2.5 cm), excision of right lower back cyst (3 cm) and 4 mm punch excision of right upper skin lesion on 02/15/2020 with Dr. Arita Miss.  Pathology results (reviewed with patient): A.  Scalp cyst excision: Pilar cyst B.  Skin, right upper lip biopsy: Arteriovenous hemangioma C.  Right lower back cyst excision: Pilar cyst  ~ 2 weeks PO Patient reports she is doing well.  Scalp, back, lip incisions are all healing nicely, C/D/I.  No signs of infection, redness, drainage, seroma/hematoma.  Patient denies fever/chills, nausea/vomiting.  Sutures were removed today from the scalp and back incision.  Patient reports suture knot fell off of lip incision yesterday.  Review of Systems  Constitutional: Negative for chills and fever.  HENT: Negative for congestion, rhinorrhea, sneezing and sore throat.   Cardiovascular: Negative for chest pain.  Gastrointestinal: Negative for nausea and vomiting.  Skin:       Incisions healing well.      Objective:   Vital Signs BP 122/73 (BP Location: Left Arm, Patient Position: Sitting, Cuff Size: Large)   Pulse 85   Temp 98.6 F (37 C) (Oral)   LMP 01/10/2016 (Approximate) Comment: not sure  SpO2 96%  Vital Signs and Nursing Note Reviewed  Physical Exam Constitutional:      General: She is not in acute distress.    Appearance: Normal appearance. She is not ill-appearing.  HENT:     Head: Normocephalic and atraumatic.  Eyes:     Extraocular Movements: Extraocular movements intact.  Pulmonary:     Effort: Pulmonary effort is normal.  Skin:    General: Skin is warm and dry.     Coloration: Skin is not pale.     Findings: No erythema or rash.     Comments: Scalp, back, and lip incisions are all healing  very nicely, C/D/I.  No signs of infection, redness, drainage, seroma/hematoma.  Sutures were removed today from the back and scalp incision.  No visible sutures present on lip incision.  Neurological:     Mental Status: She is alert and oriented to person, place, and time.     Gait: Gait is intact.  Psychiatric:        Mood and Affect: Mood and affect normal.        Behavior: Behavior normal.        Thought Content: Thought content normal.        Cognition and Memory: Memory normal.        Judgment: Judgment normal.       Assessment/Plan:     ICD-10-CM   1. Skin lesion  L98.9    Ms. Stys is doing very well.  All incisions have healed nicely, C/D/I.  Sutures removed today from back and scalp incision. No signs of infection, redness, drainage.  Follow-up as needed.  Call office with any questions/concerns.   Eldridge Abrahams, PA-C 02/29/2020, 12:04 PM

## 2020-02-29 ENCOUNTER — Other Ambulatory Visit: Payer: Self-pay

## 2020-02-29 ENCOUNTER — Encounter: Payer: Self-pay | Admitting: Plastic Surgery

## 2020-02-29 ENCOUNTER — Ambulatory Visit (INDEPENDENT_AMBULATORY_CARE_PROVIDER_SITE_OTHER): Payer: 59 | Admitting: Plastic Surgery

## 2020-02-29 VITALS — BP 122/73 | HR 85 | Temp 98.6°F

## 2020-02-29 DIAGNOSIS — L989 Disorder of the skin and subcutaneous tissue, unspecified: Secondary | ICD-10-CM

## 2020-03-15 ENCOUNTER — Other Ambulatory Visit: Payer: 59

## 2020-03-15 DIAGNOSIS — Z20822 Contact with and (suspected) exposure to covid-19: Secondary | ICD-10-CM

## 2020-03-16 LAB — SARS-COV-2, NAA 2 DAY TAT

## 2020-03-16 LAB — NOVEL CORONAVIRUS, NAA: SARS-CoV-2, NAA: DETECTED — AB

## 2020-03-17 ENCOUNTER — Other Ambulatory Visit: Payer: Self-pay | Admitting: Physician Assistant

## 2020-03-17 DIAGNOSIS — U071 COVID-19: Secondary | ICD-10-CM

## 2020-03-17 DIAGNOSIS — I10 Essential (primary) hypertension: Secondary | ICD-10-CM

## 2020-03-17 NOTE — Progress Notes (Signed)
I connected by phone with [name] [name] on 03/17/2020 at 11:59 AM to discuss the potential use of a new treatment for mild to moderate COVID-19 viral infection in non-hospitalized patients.  This patient is a 54 y.o. female that meets the FDA criteria for Emergency Use Authorization of COVID monoclonal antibody casirivimab/imdevimab, bamlanivimab/eteseviamb, or sotrovimab.  Has a (+) direct SARS-CoV-2 viral test result  Has mild or moderate COVID-19   Is NOT hospitalized due to COVID-19  Is within 10 days of symptom onset  Has at least one of the high risk factor(s) for progression to severe COVID-19 and/or hospitalization as defined in EUA.  Specific high risk criteria : BMI > 25 and Cardiovascular disease or hypertension   I have spoken and communicated the following to the patient or parent/caregiver regarding COVID monoclonal antibody treatment:  1. FDA has authorized the emergency use for the treatment of mild to moderate COVID-19 in adults and pediatric patients with positive results of direct SARS-CoV-2 viral testing who are 43 years of age and older weighing at least 40 kg, and who are at high risk for progressing to severe COVID-19 and/or hospitalization.  2. The significant known and potential risks and benefits of COVID monoclonal antibody, and the extent to which such potential risks and benefits are unknown.  3. Information on available alternative treatments and the risks and benefits of those alternatives, including clinical trials.  4. Patients treated with COVID monoclonal antibody should continue to self-isolate and use infection control measures (e.g., wear mask, isolate, social distance, avoid sharing personal items, clean and disinfect "high touch" surfaces, and frequent handwashing) according to CDC guidelines.   5. The patient or parent/caregiver has the option to accept or refuse COVID monoclonal antibody treatment.  After reviewing this information with the  patient, the patient has agreed to receive one of the available covid 19 monoclonal antibodies and will be provided an appropriate fact sheet prior to infusion.   [place], [place] 03/17/2020 11:59 AM

## 2020-03-18 ENCOUNTER — Ambulatory Visit (HOSPITAL_COMMUNITY): Payer: 59

## 2020-03-18 ENCOUNTER — Ambulatory Visit (HOSPITAL_COMMUNITY)
Admission: RE | Admit: 2020-03-18 | Discharge: 2020-03-18 | Disposition: A | Discharge status: Home / Self Care | Payer: 59 | Source: Ambulatory Visit | Attending: Pulmonary Disease | Admitting: Pulmonary Disease

## 2020-03-18 DIAGNOSIS — U071 COVID-19: Secondary | ICD-10-CM | POA: Insufficient documentation

## 2020-03-18 MED ORDER — EPINEPHRINE 0.3 MG/0.3ML IJ SOAJ: 0.3000 mg | Freq: Once | INTRAMUSCULAR | Status: DC | PRN

## 2020-03-18 MED ORDER — METHYLPREDNISOLONE SODIUM SUCC 125 MG IJ SOLR: 125.0000 mg | Freq: Once | INTRAMUSCULAR | Status: DC | PRN

## 2020-03-18 MED ORDER — FAMOTIDINE IN NACL 20-0.9 MG/50ML-% IV SOLN: 20.0000 mg | Freq: Once | INTRAVENOUS | Status: DC | PRN

## 2020-03-18 MED ORDER — SOTROVIMAB 500 MG/8ML IV SOLN
500.0000 mg | Freq: Once | INTRAVENOUS | Status: AC
  Administered 2020-03-18: 500 mg via INTRAVENOUS

## 2020-03-18 MED ORDER — SODIUM CHLORIDE 0.9 % IV SOLN: INTRAVENOUS | Status: DC | PRN

## 2020-03-18 MED ORDER — ALBUTEROL SULFATE HFA 108 (90 BASE) MCG/ACT IN AERS: 2.0000 | INHALATION_SPRAY | Freq: Once | RESPIRATORY_TRACT | Status: DC | PRN

## 2020-03-18 MED ORDER — DIPHENHYDRAMINE HCL 50 MG/ML IJ SOLN: 50.0000 mg | Freq: Once | INTRAMUSCULAR | Status: DC | PRN

## 2020-03-18 NOTE — Progress Notes (Signed)
  Diagnosis: COVID-19  Physician: Dr. Delford Field  Procedure: [facility] Med: Sotrovimab-Provided patient with Sotrovimab fact sheet for patients, parents and caregivers prior to infusion.  Complications: No immediate complications noted.  Discharge: Discharged home   [facility] 03/18/2020

## 2020-03-18 NOTE — Discharge Instructions (Signed)

## 2020-10-24 ENCOUNTER — Other Ambulatory Visit: Payer: Self-pay | Admitting: Family Medicine

## 2020-10-24 DIAGNOSIS — R7989 Other specified abnormal findings of blood chemistry: Secondary | ICD-10-CM

## 2020-11-19 ENCOUNTER — Other Ambulatory Visit: Payer: 59

## 2020-11-20 ENCOUNTER — Other Ambulatory Visit: Payer: 59

## 2020-12-04 ENCOUNTER — Ambulatory Visit
Admission: RE | Admit: 2020-12-04 | Discharge: 2020-12-04 | Disposition: A | Discharge status: Home / Self Care | Payer: 59 | Source: Ambulatory Visit | Attending: Family Medicine | Admitting: Family Medicine

## 2020-12-04 DIAGNOSIS — R7989 Other specified abnormal findings of blood chemistry: Secondary | ICD-10-CM

## 2020-12-12 ENCOUNTER — Other Ambulatory Visit: Payer: Self-pay | Admitting: Family Medicine

## 2020-12-12 DIAGNOSIS — Z1231 Encounter for screening mammogram for malignant neoplasm of breast: Secondary | ICD-10-CM

## 2021-02-04 DIAGNOSIS — Z1231 Encounter for screening mammogram for malignant neoplasm of breast: Secondary | ICD-10-CM

## 2021-03-19 ENCOUNTER — Ambulatory Visit
Admission: RE | Admit: 2021-03-19 | Discharge: 2021-03-19 | Disposition: A | Discharge status: Home / Self Care | Payer: 59 | Source: Ambulatory Visit | Attending: Family Medicine | Admitting: Family Medicine

## 2021-03-19 DIAGNOSIS — Z1231 Encounter for screening mammogram for malignant neoplasm of breast: Secondary | ICD-10-CM

## 2021-08-21 ENCOUNTER — Ambulatory Visit: Payer: Self-pay | Admitting: Allergy & Immunology

## 2021-08-31 NOTE — Progress Notes (Signed)
? ?New Patient Note ? ?RE: [name] [name] MRN: [id number] DOB: Feb 05, 1966 ?Date of Office Visit: 09/01/2021 ? ?Consult requested by: Johny Blamer, MD ?Primary care provider: Johny Blamer, MD ? ?Chief Complaint: Establish Care and Allergy Testing ? ?History of Present Illness: ?I had the pleasure of seeing [name] [name] for initial evaluation at the [facility] on 09/01/2021. She is a 56 y.o. female, who is referred here by Johny Blamer, MD for the evaluation of allergy testing. ? ?Foods: ?She reports food sensitivity to eggs and wheat.  ? ?Eggs:  ?Patient noticed localized reaction after getting vaccines with eggs. ?She never liked plain eggs before. She does eat french toast and cakes with no issues ? ?Past work up includes: 2016 wheat IgE 0.64. this was done due to her GI issues. She sometimes notices increased gas after eating wheat.  ?No recent GI evaluation.  ?Dietary History: patient has been eating other foods including milk, limited eggs, peanut, treenuts, sesame, shellfish, fish, soy, wheat, meats, fruits and vegetables. ? ?Assessment and Plan: ?[name] [name] is a 56 y.o. female with: ?Dietary counseling and surveillance ?Concerned about potential egg and wheat allergy. Noticed some GI symptoms with eating these 2 foods but tolerates french toast with no issues. 2016 wheat IgE 0.64. ?Today's skin testing showed: Negative to wheat and egg.  ?Discussed with patient that skin prick testing and bloodwork (food IgE levels) check for IgE mediated reactions which her clinical presentation does not support.  ?If you notice symptoms after eating eggs and wheat then recommend limiting it from the diet. ?If you continue to have GI issues recommend GI evaluation next. ? ?Chronic rhinitis ?Rhinitis symptoms in the fall.  Takes loratadine as needed with good benefit.  No prior work-up. ?Today's skin prick testing showed: Negative to indoor/outdoor allergens. ?Get blood work instead of intradermal  testing as we are getting blood work for other reasons. ?Use over the counter antihistamines such as Zyrtec (cetirizine), Claritin (loratadine), Allegra (fexofenadine), or Xyzal (levocetirizine) daily as needed. May take twice a day during allergy flares. May switch antihistamines every few months. ?Use Flonase (fluticasone) nasal spray 1 spray per nostril twice a day as needed for nasal congestion.  ?Nasal saline spray (i.e., Simply Saline) or nasal saline lavage (i.e., NeilMed) is recommended as needed and prior to medicated nasal sprays. ? 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  ?Continue to avoid. ?For mild symptoms you can take over the counter antihistamines such as Benadryl and monitor symptoms closely. If symptoms worsen or if you have severe symptoms including breathing issues, throat closure, significant swelling, whole body hives, severe diarrhea and vomiting, lightheadedness then inject epinephrine and seek immediate medical care afterwards. ?Get bloodwork. ? ?Adverse effect of other drugs, medicaments and biological substances, subsequent encounter ?Localized blistering after lidocaine injections.  Band-Aids and latex cause rash. Large localized reaction after certain vaccines. ?Latex: ?Continue to avoid. ?Get bloodwork. ?Lidocaine: ?Continue to avoid. Gave handout to avoid amide anesthetics (lidocaine is in this group) and okay to use ester anesthetics if needed.  ?Consider lidocaine skin testing and drug challenge in the future. ?If interested we can schedule drug challenge to lidocaine. You must be off antihistamines for 3-5 days before. Must be in good health and not ill. No vaccines/injections/antibiotics within the past 7 days. Plan on being in the office for 2-3 hours and must bring in the drug you want to  do the oral challenge for - will send in prescription to pick up a few days before. You must  call to schedule an appointment and specify it's for a drug challenge.  ?Vaccines: ?Patient most likely has a robust immune system which sometimes can cause the large localized reaction after vaccinations.  ? ?Return in about 4 months (around 01/01/2022). ? ?No orders of the defined types were placed in this encounter. ? ?Lab Orders    ?     Allergen Hymenoptera Panel    ?     Tryptase    ?     Latex, IgE    ?     Allergens w/Total IgE Area 2    ? ?Other allergy screening: ?Asthma: no ?Rhino conjunctivitis: yes ?Sore throat, PND, sinus congestion in the fall. Takes loratadine prn with good benefit. ?No prior work up.  ? ?Medication allergy: yes ?Patient gets localized blistering after lidocaine injection. This takes a few days to resolve.  ?Tolerates other localized anesthetics with no issues.  ?Band-Aids/latex gloves cause rash.  ? 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 Patient still keeps bees but she is not actively managing it.  ?Denies any systemic reactions. ?No prior work up. ? ?Urticaria: no ?Eczema:no ?History of recurrent infections suggestive of immunodeficency: no ? ?Diagnostics: ?Skin Testing: Environmental allergy panel and select foods. ?Negative to indoor/outdoor allergens. ?Negative to wheat and egg.  ?Results discussed with patient/family. ? Airborne Adult Perc - 09/01/21 1400   ? ? Time Antigen Placed 1445   ? Allergen Manufacturer Waynette Buttery   ? Location Back   ? Number of Test 59   ? 1. Control-Buffer 50% Glycerol Negative   ? 2. Control-Histamine 1 mg/ml 3+   ? 3. Albumin saline Negative   ? 4. Bahia Negative   ? 5. [place] Negative   ? 6. Johnson Negative   ? 7. Kentucky Blue Negative   ? 8. Meadow Fescue Negative   ? 9. Perennial Rye Negative   ? 10. Sweet Vernal Negative   ? 11. Timothy Negative   ? 12. Cocklebur Negative   ? 13. Burweed Marshelder Negative   ? 14. Ragweed, short Negative   ? 15. Ragweed, Giant Negative   ? 16. Plantain,  English Negative   ?  17. Lamb's Quarters Negative   ? 18. Sheep Sorrell Negative   ? 19. Rough Pigweed Negative   ? 20. Marsh Elder, Rough Negative   ? 21. Mugwort, Common Negative   ? 22. Ash mix Negative   ? 23. Charletta Cousin mix Negative   ? 24. Beech American Negative   ? 25. Box, Elder Negative   ? 26. Cedar, red Negative   ? 27. Cottonwood, [place] Negative   ? 28. Elm mix Negative   ? 29. Hickory Negative   ? 30. Maple mix Negative   ? 31. Oak, [place] mix Negative   ? 32. Pecan Pollen Negative   ? 33. Pine mix Negative   ? 34. Sycamore Eastern Negative   ? 35. Walnut, Black Pollen Negative   ? 36. Alternaria alternata Negative   ? 37. Cladosporium Herbarum Negative   ? 38. Aspergillus mix Negative   ? 39. Penicillium mix Negative   ? 40. Bipolaris sorokiniana (Helminthosporium) Negative   ? 41. Drechslera spicifera (Curvularia) Negative   ? 42. Mucor plumbeus Negative   ? 43. Fusarium moniliforme Negative   ? 44. Aureobasidium pullulans (pullulara) Negative   ? 45. Rhizopus oryzae  Negative   ? 46. Botrytis cinera Negative   ? 47. Epicoccum nigrum Negative   ? 48. Phoma betae Negative   ? 49. Candida Albicans Negative   ? 50. Trichophyton mentagrophytes Negative   ? 51. Mite, D Farinae  5,000 AU/ml Negative   ? 52. Mite, D Pteronyssinus  5,000 AU/ml Negative   ? 53. Cat Hair 10,000 BAU/ml Negative   ? 54.  Dog Epithelia Negative   ? 55. Mixed Feathers Negative   ? 56. Horse Epithelia Negative   ? 57. Cockroach, [place] Negative   ? 58. Mouse Negative   ? 59. Tobacco Leaf Negative   ? ?  ?  ? ?  ? ? Food Adult Perc - 09/01/21 1400   ? ? Time Antigen Placed 1445   ? Allergen Manufacturer Waynette ButteryGreer   ? Location Back   ? Number of allergen test 2   ? 3. Wheat Negative   ? 6. Egg White, Chicken Negative   ? ?  ?  ? ?  ? ? ?Past Medical History: ?Patient Active Problem List  ? Diagnosis Date Noted  ? Dietary counseling and surveillance 09/01/2021  ? Chronic rhinitis 09/01/2021  ? 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 Adverse effect of  other drugs, medicaments and biological substances, subsequent encounter 09/01/2021  ? 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 ?Past Medical History:  ?Diagnosis Date  ? 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

## 2021-09-01 ENCOUNTER — Ambulatory Visit (INDEPENDENT_AMBULATORY_CARE_PROVIDER_SITE_OTHER): Payer: 59 | Admitting: Allergy

## 2021-09-01 ENCOUNTER — Encounter: Payer: Self-pay | Admitting: Allergy

## 2021-09-01 VITALS — BP 122/82 | HR 75 | Temp 97.7°F | Resp 16 | Ht 64.0 in | Wt 204.2 lb

## 2021-09-01 DIAGNOSIS — T50995D Adverse effect of other drugs, medicaments and biological substances, subsequent encounter: Secondary | ICD-10-CM

## 2021-09-01 DIAGNOSIS — T50995A Adverse effect of other drugs, medicaments and biological substances, initial encounter: Secondary | ICD-10-CM | POA: Diagnosis not present

## 2021-09-01 DIAGNOSIS — T63481A Toxic effect of venom of other arthropod, accidental (unintentional), initial encounter: Secondary | ICD-10-CM | POA: Diagnosis not present

## 2021-09-01 DIAGNOSIS — J310 Chronic rhinitis: Secondary | ICD-10-CM | POA: Diagnosis not present

## 2021-09-01 DIAGNOSIS — T781XXA Other adverse food reactions, not elsewhere classified, initial encounter: Secondary | ICD-10-CM | POA: Diagnosis not present

## 2021-09-01 DIAGNOSIS — Z713 Dietary counseling and surveillance: Secondary | ICD-10-CM | POA: Insufficient documentation

## 2021-09-01 DIAGNOSIS — T781XXD Other adverse food reactions, not elsewhere classified, subsequent encounter: Secondary | ICD-10-CM

## 2021-09-01 NOTE — Assessment & Plan Note (Signed)
Large localized reaction after stings in the past.  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  ?? Continue to avoid. ?? For mild symptoms you can take over the counter antihistamines such as Benadryl and monitor symptoms closely. If symptoms worsen or if you have severe symptoms including breathing issues, throat closure, significant swelling, whole body hives, severe diarrhea and vomiting, lightheadedness then inject epinephrine and seek immediate medical care afterwards. ?? Get bloodwork. ?

## 2021-09-01 NOTE — Assessment & Plan Note (Signed)
Rhinitis symptoms in the fall.  Takes loratadine as needed with good benefit.  No prior work-up. ?? Today's skin prick testing showed: Negative to indoor/outdoor allergens. ?? Get blood work instead of intradermal testing as we are getting blood work for other reasons. ?? Use over the counter antihistamines such as Zyrtec (cetirizine), Claritin (loratadine), Allegra (fexofenadine), or Xyzal (levocetirizine) daily as needed. May take twice a day during allergy flares. May switch antihistamines every few months. ?? Use Flonase (fluticasone) nasal spray 1 spray per nostril twice a day as needed for nasal congestion.  ?? Nasal saline spray (i.e., Simply Saline) or nasal saline lavage (i.e., NeilMed) is recommended as needed and prior to medicated nasal sprays. ?

## 2021-09-01 NOTE — Patient Instructions (Addendum)
Today's skin testing showed: ?Negative to indoor/outdoor allergens. ?Negative to wheat and egg.  ? ?Results given. ? ?Food: ?Discussed with patient that skin prick testing and bloodwork (food IgE levels) check for IgE mediated reactions which your clinical presentation does not support.  ?If you notice symptoms after eating eggs and wheat then recommend limiting it from your diet. ?If you continue to have GI issues recommend GI evaluation next. ? ?Latex: ?Continue to avoid. ?Get bloodwork. ? ?Stinging insects. ?Continue to avoid. ?For mild symptoms you can take over the counter antihistamines such as Benadryl and monitor symptoms closely. If symptoms worsen or if you have severe symptoms including breathing issues, throat closure, significant swelling, whole body hives, severe diarrhea and vomiting, lightheadedness then inject epinephrine and seek immediate medical care afterwards. ?Get bloodwork. ? ?Lidocaine ?Continue to avoid. ?Consider lidocaine skin testing and drug challenge in the future. ?If interested we can schedule drug challenge to lidocaine. You must be off antihistamines for 3-5 days before. Must be in good health and not ill. No vaccines/injections/antibiotics within the past 7 days. Plan on being in the office for 2-3 hours and must bring in the drug you want to do the oral challenge for - will send in prescription to pick up a few days before. You must call to schedule an appointment and specify it's for a drug challenge.  ? ?Rhinitis ?Use over the counter antihistamines such as Zyrtec (cetirizine), Claritin (loratadine), Allegra (fexofenadine), or Xyzal (levocetirizine) daily as needed. May take twice a day during allergy flares. May switch antihistamines every few months. ?Use Flonase (fluticasone) nasal spray 1 spray per nostril twice a day as needed for nasal congestion.  ?Nasal saline spray (i.e., Simply Saline) or nasal saline lavage (i.e., NeilMed) is recommended as needed and prior to  medicated nasal sprays. ? ?Get bloodwork ?We are ordering labs, so please allow 1-2 weeks for the results to come back. ?With the newly implemented Cures Act, the labs might be visible to you at the same time that they become visible to me. However, I will not address the results until all of the results are back, so please be patient.  ?In the meantime, continue recommendations in your patient instructions, including avoidance measures (if applicable), until you hear from me. ? ?Follow up in 4 months or sooner if needed.   ?

## 2021-09-01 NOTE — Assessment & Plan Note (Signed)
Concerned about potential egg and wheat allergy. Noticed some GI symptoms with eating these 2 foods but tolerates french toast with no issues. 2016 wheat IgE 0.64. ?? Today's skin testing showed: Negative to wheat and egg.  ?? Discussed with patient that skin prick testing and bloodwork (food IgE levels) check for IgE mediated reactions which her clinical presentation does not support.  ?? If you notice symptoms after eating eggs and wheat then recommend limiting it from the diet. ?? If you continue to have GI issues recommend GI evaluation next. ?

## 2021-09-01 NOTE — Assessment & Plan Note (Addendum)
Localized blistering after lidocaine injections.  Band-Aids and latex cause rash. Large localized reaction after certain vaccines. ?Latex: ?? Continue to avoid. ?? Get bloodwork. ?Lidocaine: ?? Continue to avoid. Gave handout to avoid amide anesthetics (lidocaine is in this group) and okay to use ester anesthetics if needed.  ?? Consider lidocaine skin testing and drug challenge in the future. ?? If interested we can schedule drug challenge to lidocaine. You must be off antihistamines for 3-5 days before. Must be in good health and not ill. No vaccines/injections/antibiotics within the past 7 days. Plan on being in the office for 2-3 hours and must bring in the drug you want to do the oral challenge for - will send in prescription to pick up a few days before. You must call to schedule an appointment and specify it's for a drug challenge.  ?Vaccines: ?? Patient most likely has a robust immune system which sometimes can cause the large localized reaction after vaccinations.  ?

## 2021-09-04 LAB — ALLERGEN HYMENOPTERA PANEL
Bumblebee: <0.1 kU/L
Honeybee IgE: 0.51 kU/L — AB
Hornet, White Face, IgE: <0.1 kU/L
Hornet, Yellow, IgE: <0.1 kU/L
Paper Wasp IgE: <0.1 kU/L
Yellow Jacket, IgE: 0.12 kU/L — AB

## 2021-09-04 LAB — ALLERGENS W/TOTAL IGE AREA 2
Alternaria Alternata IgE: <0.1 kU/L
Aspergillus Fumigatus IgE: <0.1 kU/L
Bermuda Grass IgE: <0.1 kU/L
Cat Dander IgE: 0.22 kU/L — AB
Cedar, Mountain IgE: 0.13 kU/L — AB
Cladosporium Herbarum IgE: <0.1 kU/L
Cockroach, German IgE: 0.24 kU/L — AB
Common Silver Birch IgE: <0.1 kU/L
Cottonwood IgE: <0.1 kU/L
D Farinae IgE: <0.1 kU/L
D Pteronyssinus IgE: <0.1 kU/L
Dog Dander IgE: 0.23 kU/L — AB
Elm, American IgE: <0.1 kU/L
IgE (Immunoglobulin E), Serum: 189 IU/mL (ref 6–495)
Johnson Grass IgE: <0.1 kU/L
Maple/Box Elder IgE: <0.1 kU/L
Mouse Urine IgE: <0.1 kU/L
Oak, White IgE: 0.11 kU/L — AB
Pecan, Hickory IgE: <0.1 kU/L
Penicillium Chrysogen IgE: <0.1 kU/L
Pigweed, Rough IgE: <0.1 kU/L
Ragweed, Short IgE: <0.1 kU/L
Sheep Sorrel IgE Qn: <0.1 kU/L
Timothy Grass IgE: <0.1 kU/L
White Mulberry IgE: <0.1 kU/L

## 2021-09-04 LAB — LATEX, IGE: Latex IgE: <0.1 kU/L

## 2021-09-04 LAB — TRYPTASE: Tryptase: 5.3 ug/L (ref 2.2–13.2)

## 2022-01-05 ENCOUNTER — Ambulatory Visit: Payer: 59 | Admitting: Allergy

## 2022-02-03 ENCOUNTER — Other Ambulatory Visit: Payer: Self-pay | Admitting: Family Medicine

## 2022-02-03 DIAGNOSIS — Z1231 Encounter for screening mammogram for malignant neoplasm of breast: Secondary | ICD-10-CM

## 2022-03-20 ENCOUNTER — Ambulatory Visit: Payer: 59

## 2022-04-15 ENCOUNTER — Ambulatory Visit: Payer: 59

## 2022-04-28 ENCOUNTER — Ambulatory Visit: Payer: 59

## 2022-06-18 ENCOUNTER — Ambulatory Visit
Admission: RE | Admit: 2022-06-18 | Discharge: 2022-06-18 | Disposition: A | Discharge status: Home / Self Care | Payer: 59 | Source: Ambulatory Visit | Attending: Family Medicine | Admitting: Family Medicine

## 2022-06-18 DIAGNOSIS — Z1231 Encounter for screening mammogram for malignant neoplasm of breast: Secondary | ICD-10-CM

## 2022-06-30 ENCOUNTER — Other Ambulatory Visit: Payer: Self-pay

## 2023-03-10 ENCOUNTER — Other Ambulatory Visit: Payer: Self-pay | Admitting: Family Medicine

## 2023-03-10 DIAGNOSIS — Z1231 Encounter for screening mammogram for malignant neoplasm of breast: Secondary | ICD-10-CM

## 2023-06-23 ENCOUNTER — Ambulatory Visit: Payer: 59

## 2023-07-02 ENCOUNTER — Ambulatory Visit: Payer: 59

## 2023-07-08 ENCOUNTER — Inpatient Hospital Stay: Admission: RE | Admit: 2023-07-08 | Payer: 59 | Source: Ambulatory Visit

## 2023-07-09 ENCOUNTER — Ambulatory Visit
Admission: RE | Admit: 2023-07-09 | Discharge: 2023-07-09 | Disposition: A | Discharge status: Home / Self Care | Payer: 59 | Source: Ambulatory Visit | Attending: Family Medicine | Admitting: Family Medicine

## 2023-07-09 DIAGNOSIS — Z1231 Encounter for screening mammogram for malignant neoplasm of breast: Secondary | ICD-10-CM

## 2023-07-14 ENCOUNTER — Other Ambulatory Visit: Payer: Self-pay | Admitting: Family Medicine

## 2023-07-14 DIAGNOSIS — R928 Other abnormal and inconclusive findings on diagnostic imaging of breast: Secondary | ICD-10-CM

## 2023-07-21 ENCOUNTER — Ambulatory Visit
Admission: RE | Admit: 2023-07-21 | Discharge: 2023-07-21 | Disposition: A | Discharge status: Home / Self Care | Source: Ambulatory Visit | Attending: Family Medicine | Admitting: Family Medicine

## 2023-07-21 DIAGNOSIS — R928 Other abnormal and inconclusive findings on diagnostic imaging of breast: Secondary | ICD-10-CM
